# Patient Record
Sex: Male | Born: 1935 | Race: White | Hispanic: No | Marital: Married | Smoking: Former smoker
Health system: Southern US, Community
[De-identification: ages and names within clinical notes are randomized; demographics above are authoritative.]

## PROBLEM LIST (undated history)

## (undated) DIAGNOSIS — Q396 Congenital diverticulum of esophagus: Secondary | ICD-10-CM

## (undated) DIAGNOSIS — I739 Peripheral vascular disease, unspecified: Secondary | ICD-10-CM

## (undated) DIAGNOSIS — I10 Essential (primary) hypertension: Secondary | ICD-10-CM

## (undated) DIAGNOSIS — E785 Hyperlipidemia, unspecified: Secondary | ICD-10-CM

## (undated) DIAGNOSIS — I351 Nonrheumatic aortic (valve) insufficiency: Secondary | ICD-10-CM

## (undated) DIAGNOSIS — I714 Abdominal aortic aneurysm, without rupture, unspecified: Secondary | ICD-10-CM

## (undated) DIAGNOSIS — R233 Spontaneous ecchymoses: Secondary | ICD-10-CM

## (undated) DIAGNOSIS — I712 Thoracic aortic aneurysm, without rupture: Secondary | ICD-10-CM

## (undated) DIAGNOSIS — T4145XA Adverse effect of unspecified anesthetic, initial encounter: Secondary | ICD-10-CM

## (undated) DIAGNOSIS — K922 Gastrointestinal hemorrhage, unspecified: Secondary | ICD-10-CM

## (undated) DIAGNOSIS — T8859XA Other complications of anesthesia, initial encounter: Secondary | ICD-10-CM

## (undated) DIAGNOSIS — K259 Gastric ulcer, unspecified as acute or chronic, without hemorrhage or perforation: Secondary | ICD-10-CM

## (undated) DIAGNOSIS — R238 Other skin changes: Secondary | ICD-10-CM

## (undated) DIAGNOSIS — I7121 Aneurysm of the ascending aorta, without rupture: Secondary | ICD-10-CM

## (undated) DIAGNOSIS — K220 Achalasia of cardia: Secondary | ICD-10-CM

## (undated) DIAGNOSIS — G9340 Encephalopathy, unspecified: Secondary | ICD-10-CM

## (undated) HISTORY — DX: Encephalopathy, unspecified: G93.40

## (undated) HISTORY — DX: Spontaneous ecchymoses: R23.3

## (undated) HISTORY — DX: Gastrointestinal hemorrhage, unspecified: K92.2

## (undated) HISTORY — DX: Aneurysm of the ascending aorta, without rupture: I71.21

## (undated) HISTORY — DX: Abdominal aortic aneurysm, without rupture: I71.4

## (undated) HISTORY — DX: Thoracic aortic aneurysm, without rupture: I71.2

## (undated) HISTORY — DX: Other skin changes: R23.8

## (undated) HISTORY — DX: Achalasia of cardia: K22.0

## (undated) HISTORY — PX: HERNIA REPAIR: SHX51

## (undated) HISTORY — DX: Hyperlipidemia, unspecified: E78.5

## (undated) HISTORY — DX: Peripheral vascular disease, unspecified: I73.9

## (undated) HISTORY — DX: Nonrheumatic aortic (valve) insufficiency: I35.1

## (undated) HISTORY — PX: ABDOMINAL AORTIC ANEURYSM REPAIR: SUR1152

## (undated) HISTORY — DX: Essential (primary) hypertension: I10

## (undated) HISTORY — DX: Abdominal aortic aneurysm, without rupture, unspecified: I71.40

## (undated) HISTORY — PX: TRANSURETHRAL RESECTION OF PROSTATE: SHX73

---

## 1998-06-15 ENCOUNTER — Other Ambulatory Visit: Admission: RE | Admit: 1998-06-15 | Discharge: 1998-06-15 | Payer: Self-pay | Admitting: Cardiology

## 1999-01-24 ENCOUNTER — Encounter: Payer: Self-pay | Admitting: Cardiology

## 1999-01-24 ENCOUNTER — Ambulatory Visit (HOSPITAL_COMMUNITY): Admission: RE | Admit: 1999-01-24 | Discharge: 1999-01-24 | Payer: Self-pay | Admitting: Cardiology

## 2000-01-30 ENCOUNTER — Encounter: Payer: Self-pay | Admitting: Cardiology

## 2000-01-30 ENCOUNTER — Ambulatory Visit (HOSPITAL_COMMUNITY): Admission: RE | Admit: 2000-01-30 | Discharge: 2000-01-30 | Payer: Self-pay | Admitting: Cardiology

## 2001-03-05 ENCOUNTER — Ambulatory Visit (HOSPITAL_COMMUNITY): Admission: RE | Admit: 2001-03-05 | Discharge: 2001-03-05 | Payer: Self-pay | Admitting: Cardiology

## 2001-03-05 ENCOUNTER — Encounter: Payer: Self-pay | Admitting: Cardiology

## 2001-03-06 ENCOUNTER — Ambulatory Visit (HOSPITAL_COMMUNITY): Admission: RE | Admit: 2001-03-06 | Discharge: 2001-03-06 | Payer: Self-pay | Admitting: Cardiology

## 2001-03-06 ENCOUNTER — Encounter: Payer: Self-pay | Admitting: Cardiology

## 2001-03-11 ENCOUNTER — Encounter: Payer: Self-pay | Admitting: Cardiology

## 2001-03-11 ENCOUNTER — Ambulatory Visit (HOSPITAL_COMMUNITY): Admission: RE | Admit: 2001-03-11 | Discharge: 2001-03-11 | Payer: Self-pay | Admitting: Cardiology

## 2001-05-12 ENCOUNTER — Emergency Department (HOSPITAL_COMMUNITY): Admission: EM | Admit: 2001-05-12 | Discharge: 2001-05-12 | Payer: Self-pay | Admitting: Emergency Medicine

## 2001-09-20 ENCOUNTER — Ambulatory Visit (HOSPITAL_COMMUNITY): Admission: RE | Admit: 2001-09-20 | Discharge: 2001-09-20 | Payer: Self-pay | Admitting: Gastroenterology

## 2002-09-09 ENCOUNTER — Ambulatory Visit (HOSPITAL_COMMUNITY): Admission: RE | Admit: 2002-09-09 | Discharge: 2002-09-09 | Payer: Self-pay | Admitting: Cardiology

## 2002-09-09 ENCOUNTER — Encounter: Payer: Self-pay | Admitting: Cardiology

## 2002-12-23 ENCOUNTER — Ambulatory Visit (HOSPITAL_COMMUNITY): Admission: RE | Admit: 2002-12-23 | Discharge: 2002-12-23 | Payer: Self-pay | Admitting: Gastroenterology

## 2003-04-14 ENCOUNTER — Ambulatory Visit (HOSPITAL_COMMUNITY): Admission: RE | Admit: 2003-04-14 | Discharge: 2003-04-14 | Payer: Self-pay | Admitting: Gastroenterology

## 2003-07-20 ENCOUNTER — Encounter: Payer: Self-pay | Admitting: Internal Medicine

## 2003-07-20 ENCOUNTER — Encounter: Admission: RE | Admit: 2003-07-20 | Discharge: 2003-07-20 | Payer: Self-pay | Admitting: Internal Medicine

## 2003-07-27 ENCOUNTER — Encounter: Admission: RE | Admit: 2003-07-27 | Discharge: 2003-07-27 | Payer: Self-pay | Admitting: Internal Medicine

## 2003-07-27 ENCOUNTER — Encounter: Payer: Self-pay | Admitting: Internal Medicine

## 2003-09-14 ENCOUNTER — Encounter: Payer: Self-pay | Admitting: *Deleted

## 2003-09-14 ENCOUNTER — Encounter: Admission: RE | Admit: 2003-09-14 | Discharge: 2003-09-14 | Payer: Self-pay | Admitting: *Deleted

## 2003-09-22 ENCOUNTER — Encounter: Payer: Self-pay | Admitting: *Deleted

## 2003-09-24 ENCOUNTER — Ambulatory Visit (HOSPITAL_COMMUNITY): Admission: RE | Admit: 2003-09-24 | Discharge: 2003-09-24 | Payer: Self-pay | Admitting: *Deleted

## 2003-10-08 ENCOUNTER — Inpatient Hospital Stay (HOSPITAL_COMMUNITY): Admission: RE | Admit: 2003-10-08 | Discharge: 2003-11-20 | Payer: Self-pay | Admitting: *Deleted

## 2003-10-21 ENCOUNTER — Encounter: Payer: Self-pay | Admitting: Cardiology

## 2003-12-02 ENCOUNTER — Encounter: Admission: RE | Admit: 2003-12-02 | Discharge: 2004-03-01 | Payer: Self-pay | Admitting: General Surgery

## 2003-12-24 ENCOUNTER — Ambulatory Visit (HOSPITAL_COMMUNITY): Admission: RE | Admit: 2003-12-24 | Discharge: 2003-12-24 | Payer: Self-pay | Admitting: General Surgery

## 2005-04-28 IMAGING — CT CT HEAD W/O CM
1 series · 16 of 30 positions shown, 20 images · non-contrast
Comparison: none

CLINICAL DATA: Ventilator, Status post AAA repair.  Altered mental status. 
 CT HEAD WITHOUT CONTRAST 10/11/03
 Routine imaging performed through the brain without IV contrast. 
 There is diffuse cerebral atrophy.  No acute intracranial abnormality.  Specifically no evidence of hemorrhage, hydrocephalus, tumor, vascular lesion, acute infarction or significant intracranial injury.  Mild chronic sinusitis changes in the paranasal sinuses.  Calvarium unremarkable.  
 IMPRESSION
 Atrophy.  No acute intracranial process.

[Series 2: trauma head · axial · 0.47mm/px · z∈[+104,+245]mm · 16 of 30 slices shown, 20 images]
[im 2/30  brain]
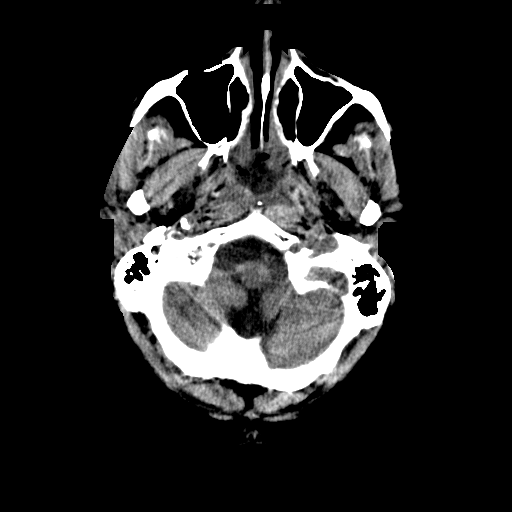
[im 2/30  bone]
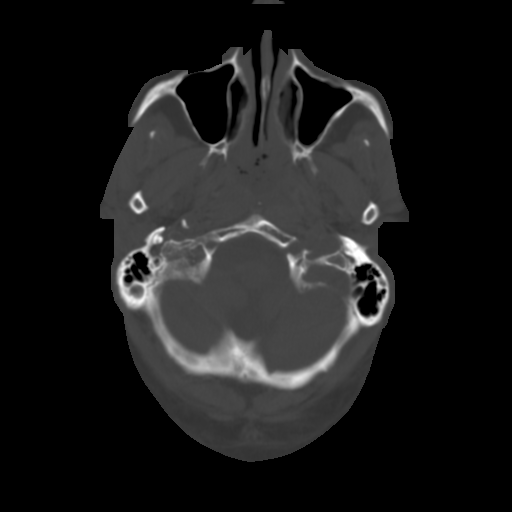
[im 4/30  brain]
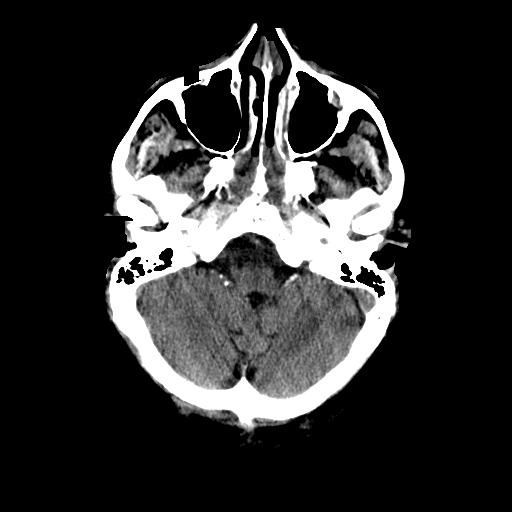
[im 6/30  brain]
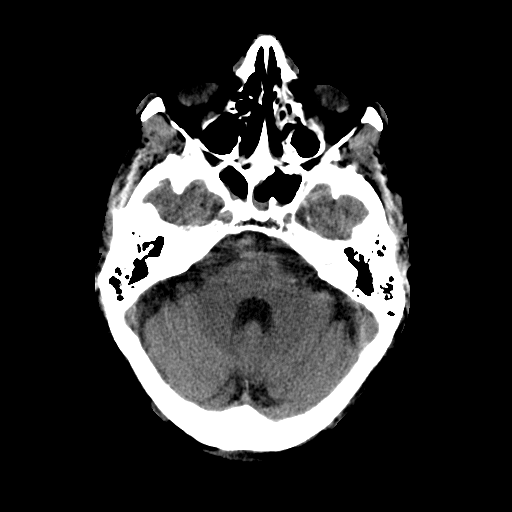
[im 8/30  brain]
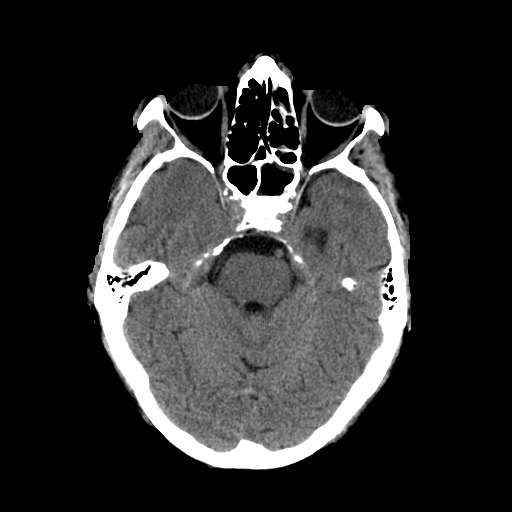
[im 9/30  brain]
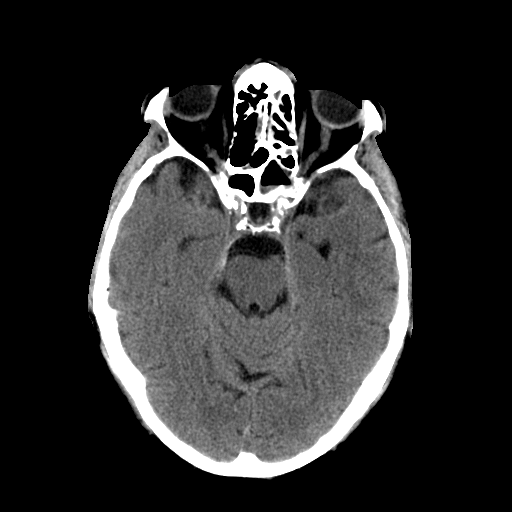
[im 9/30  bone]
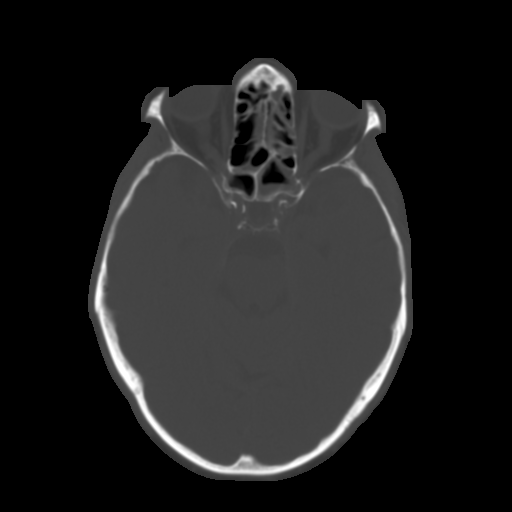
[im 11/30  brain]
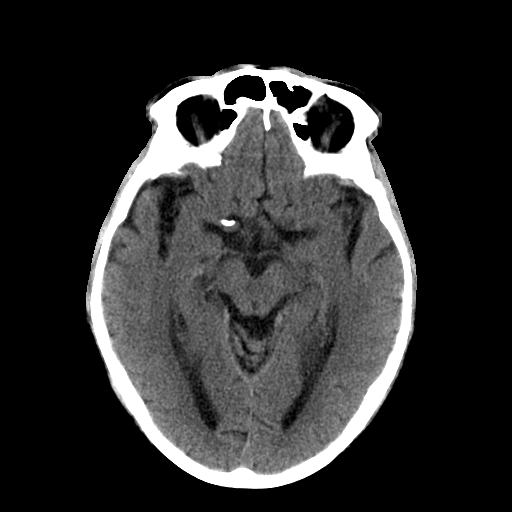
[im 13/30  brain]
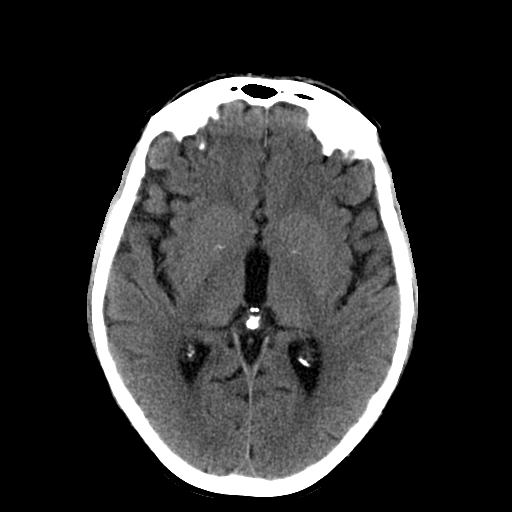
[im 15/30  brain]
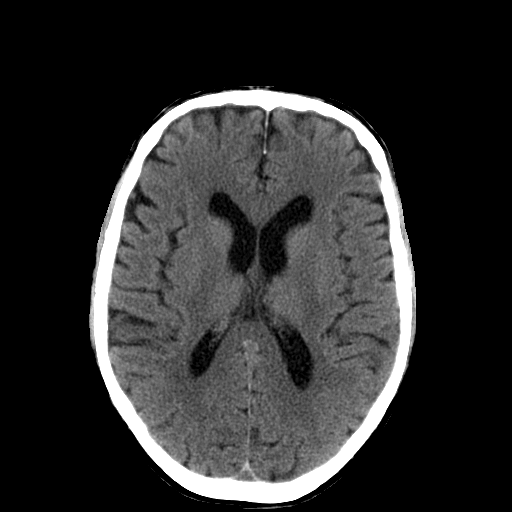
[im 16/30  brain]
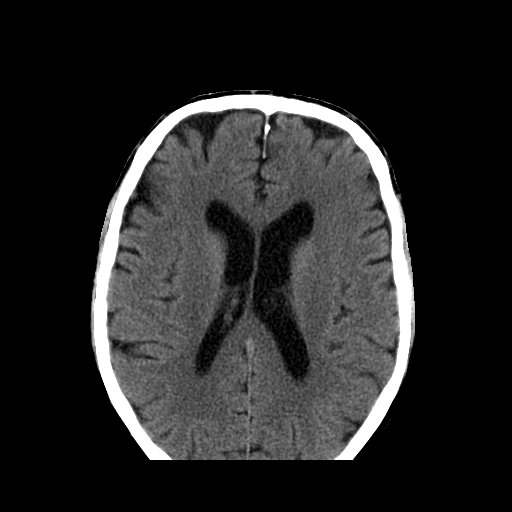
[im 16/30  bone]
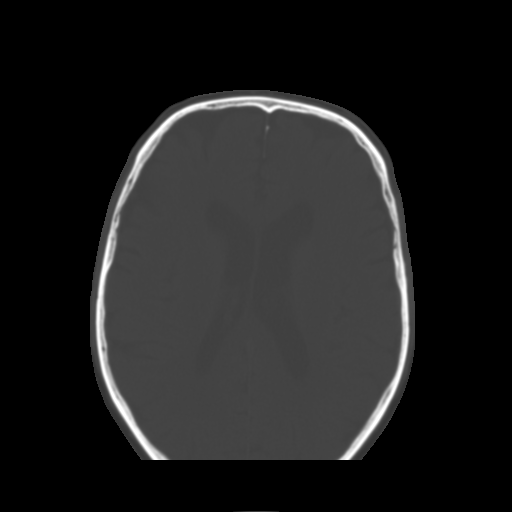
[im 18/30  brain]
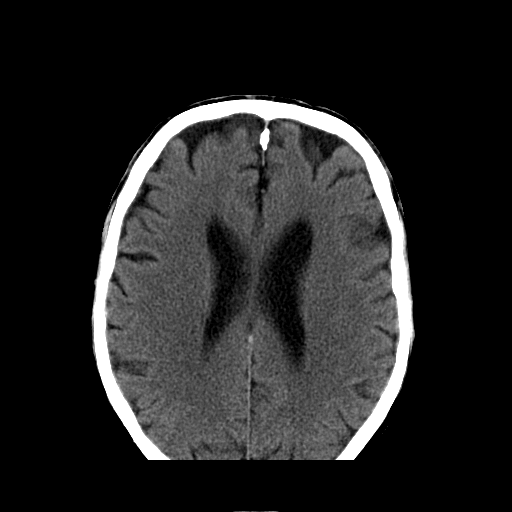
[im 20/30  brain]
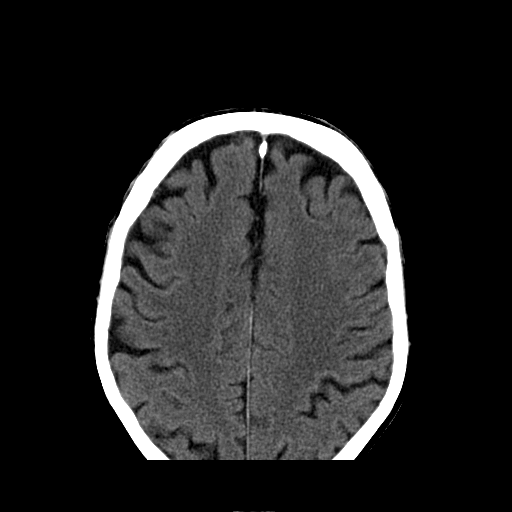
[im 22/30  brain]
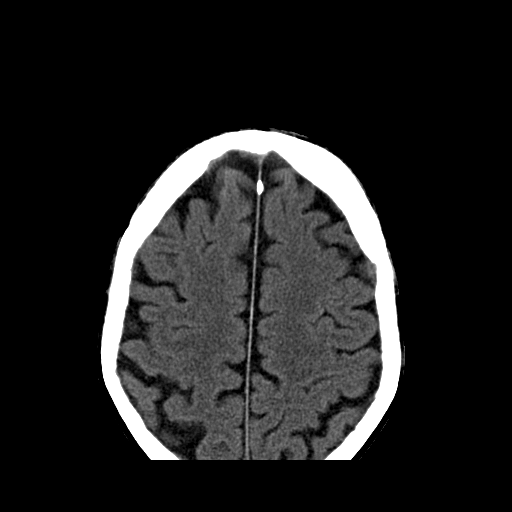
[im 23/30  brain]
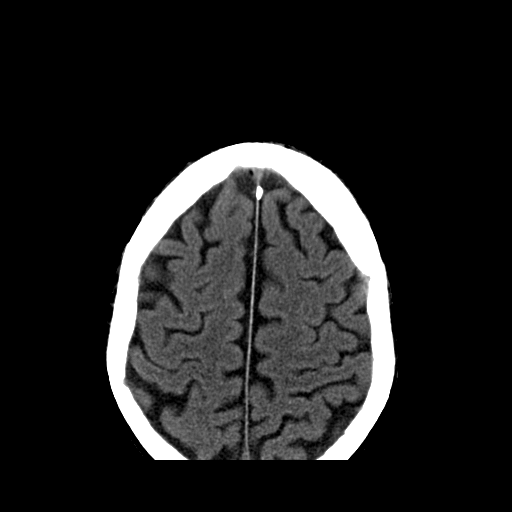
[im 23/30  bone]
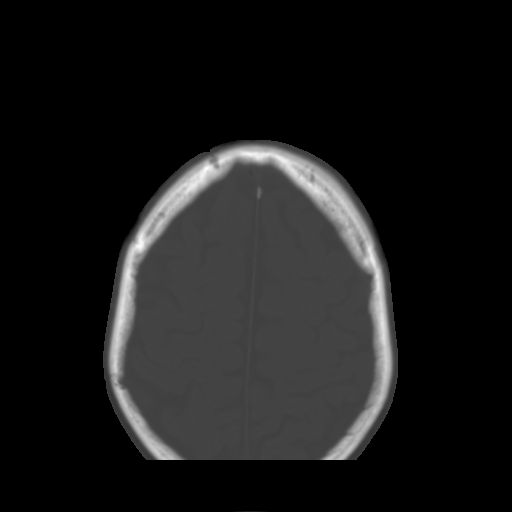
[im 25/30  brain]
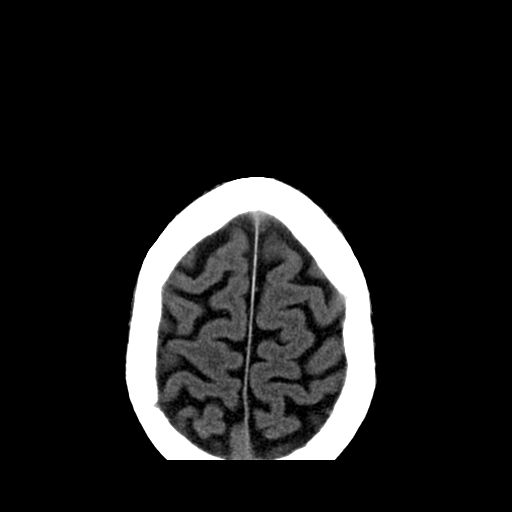
[im 27/30  brain]
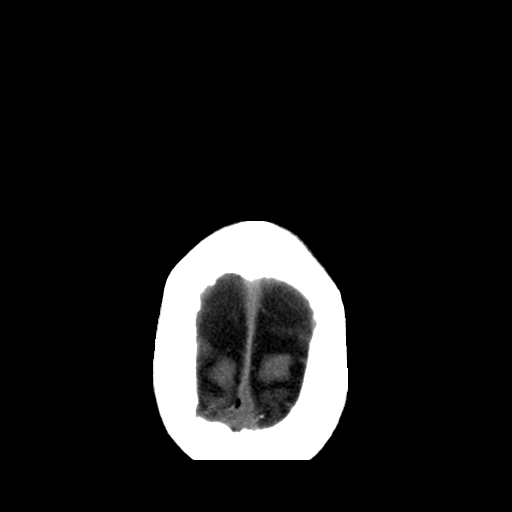
[im 29/30  brain]
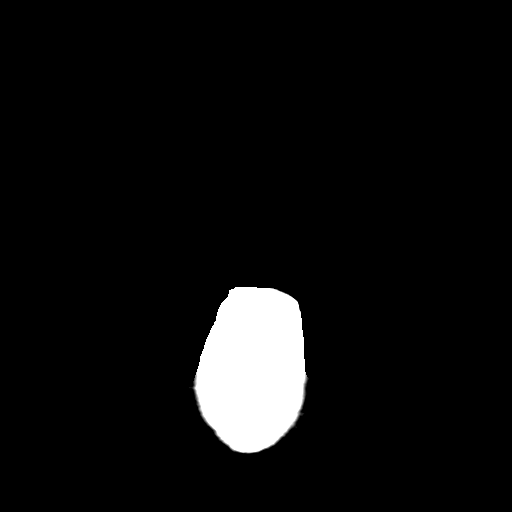

[16 of 30 positions shown; findings below may reference images not displayed]

## 2005-05-08 IMAGING — CT CT HEAD W/O CM
1 series · 16 of 30 positions shown, 20 images · non-contrast
Comparison: none

CLINICAL DATA: Mental status changes. Recent vascular surgery.
 HEAD CT WITHOUT CONTRAST
 5mm scans were made through the whole head and compared to a study of 10/11/03
 There is no definable change since the prior examination.  The brain shows mild generalized atrophy without evidence of focal stroke, mass, hemorrhage, hydrocephalus or extra-axial collection.  The paranasal sinuses show some inflammatory changes in the sphenoid and ethmoid regions.  
 IMPRESSION 
 Mild brain atrophy without evidence of acute or focal pathology. No change when compared to the study of [DATE]. 
 Sinusitis.

[Series 2: brain · axial · 0.47mm/px · z∈[+131,+272]mm · 16 of 30 slices shown, 20 images]
[im 2/30  brain]
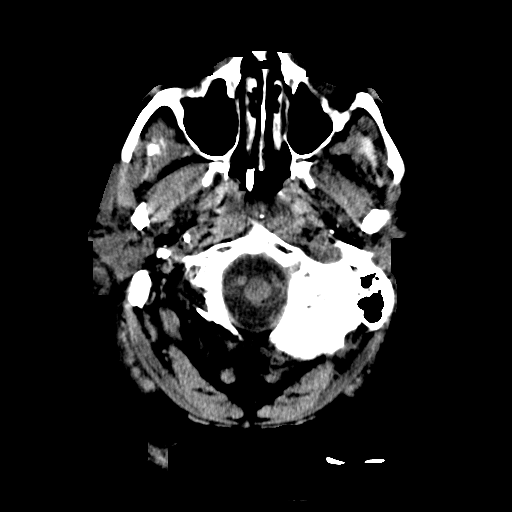
[im 2/30  bone]
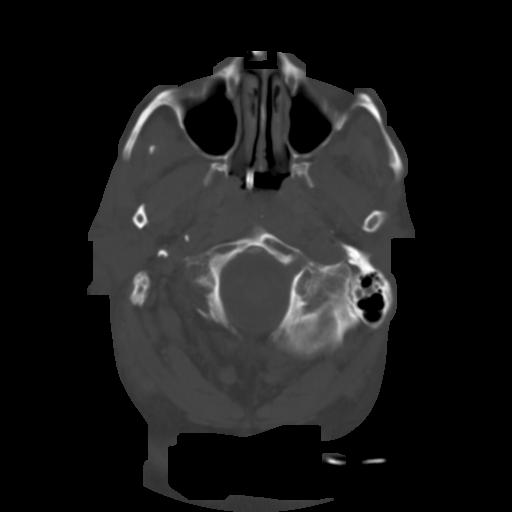
[im 4/30  brain]
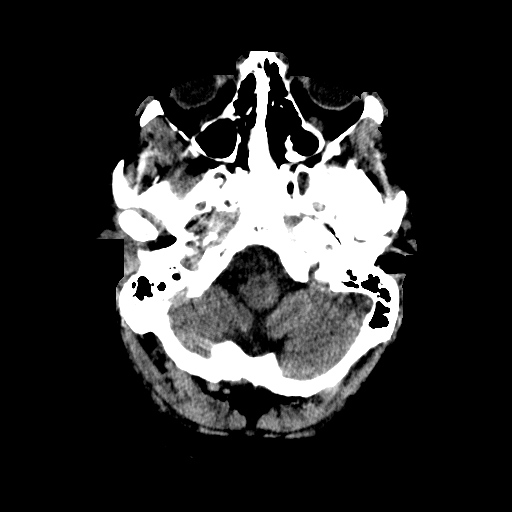
[im 6/30  brain]
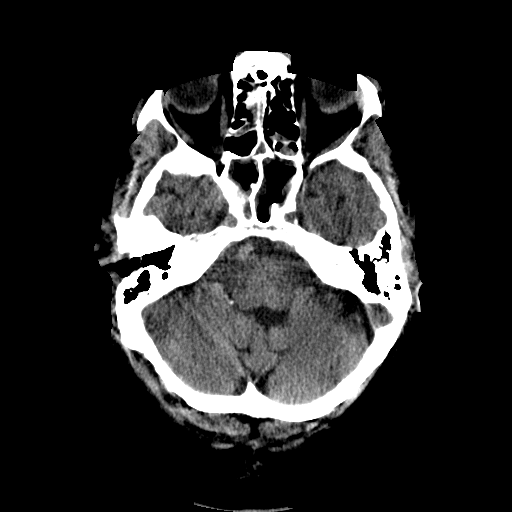
[im 8/30  brain]
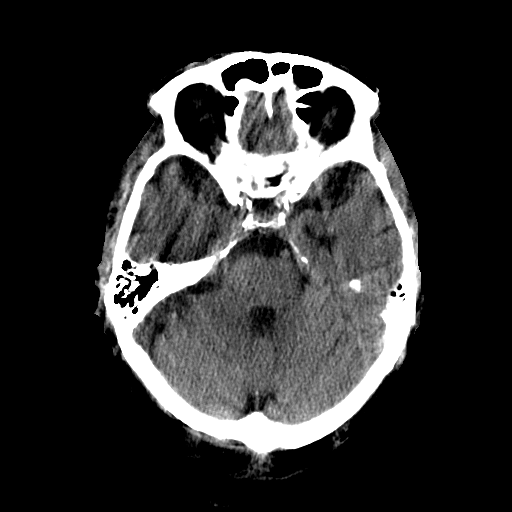
[im 9/30  brain]
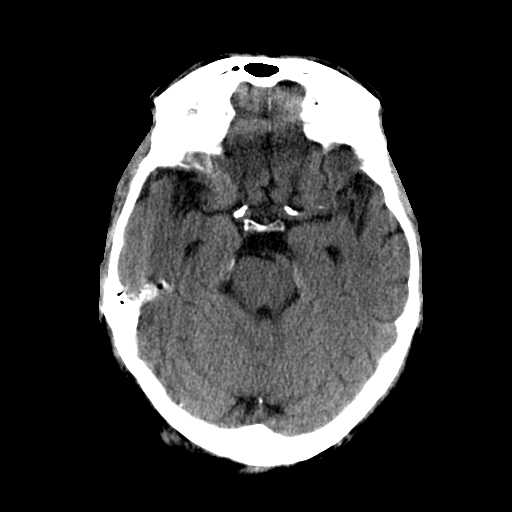
[im 9/30  bone]
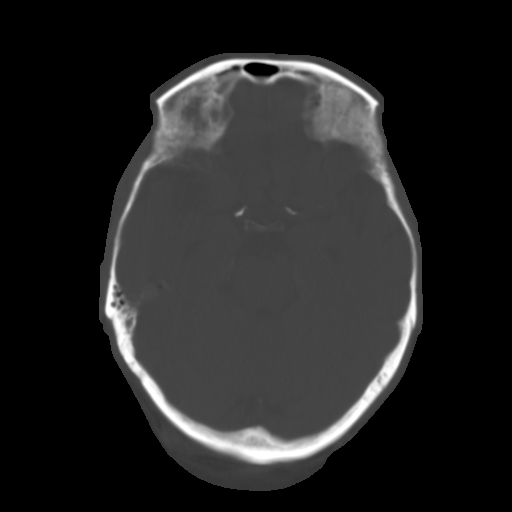
[im 11/30  brain]
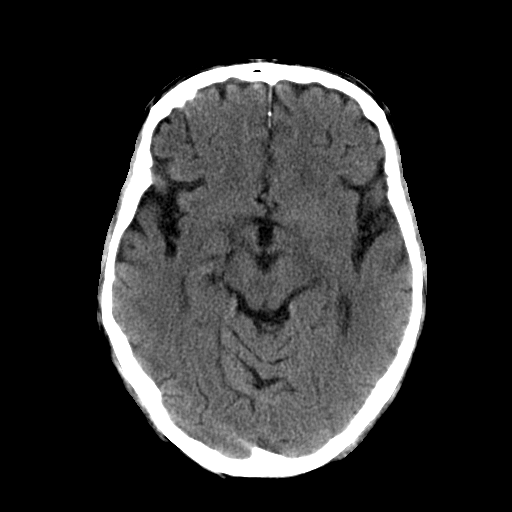
[im 13/30  brain]
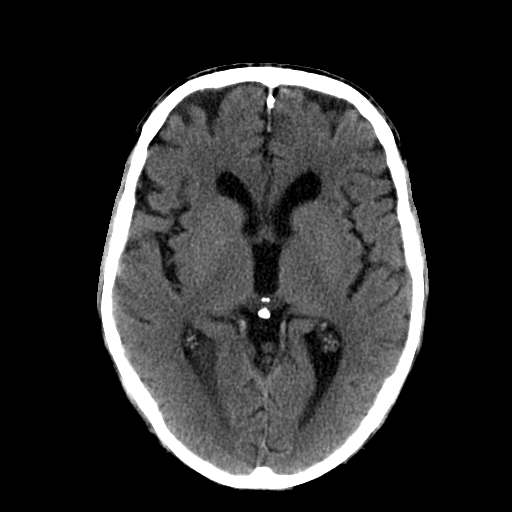
[im 15/30  brain]
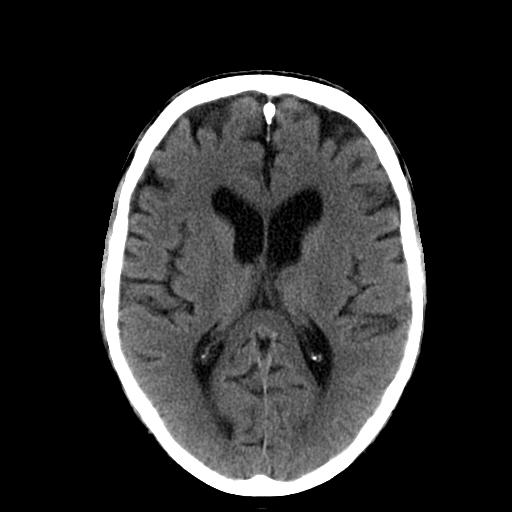
[im 16/30  brain]
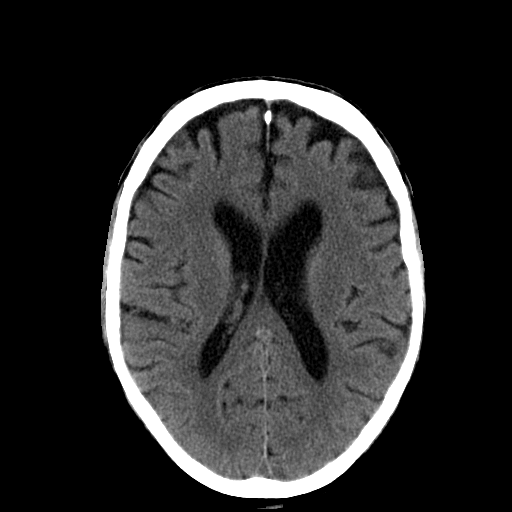
[im 16/30  bone]
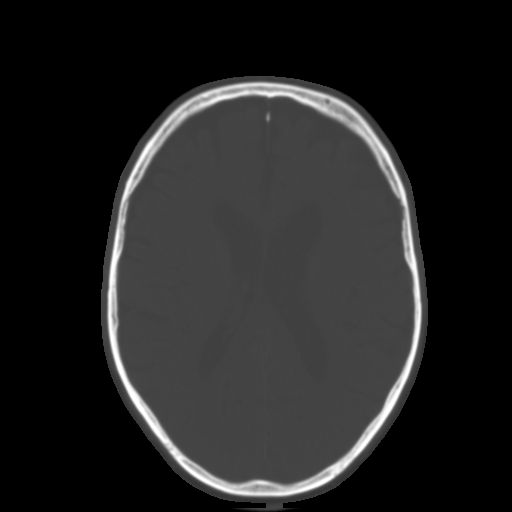
[im 18/30  brain]
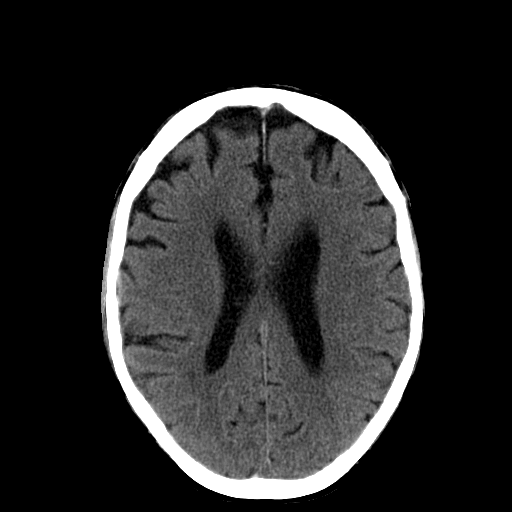
[im 20/30  brain]
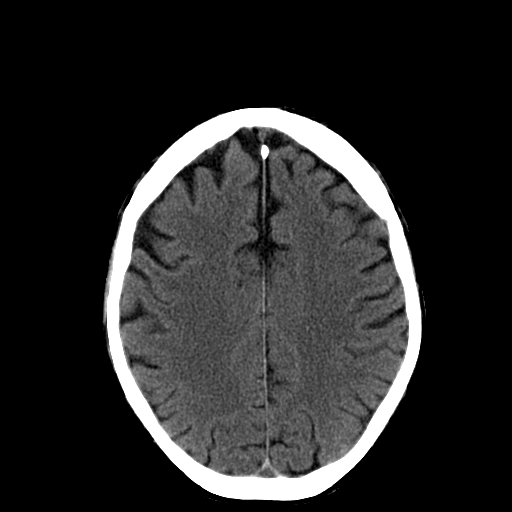
[im 22/30  brain]
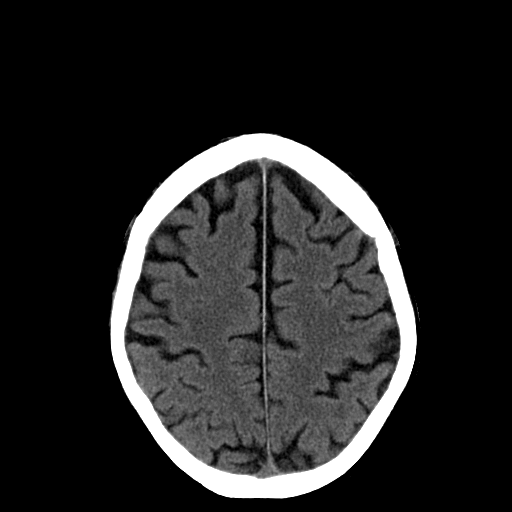
[im 23/30  brain]
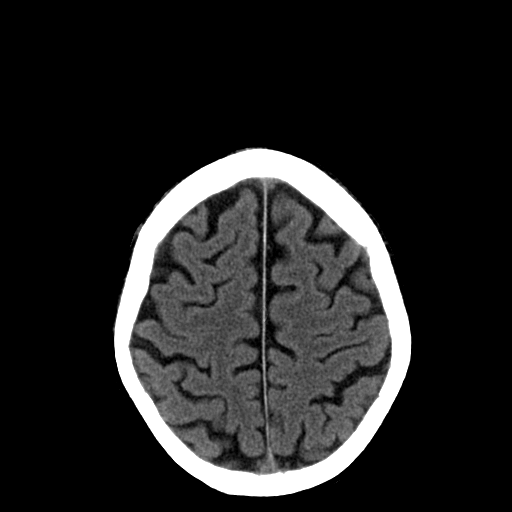
[im 23/30  bone]
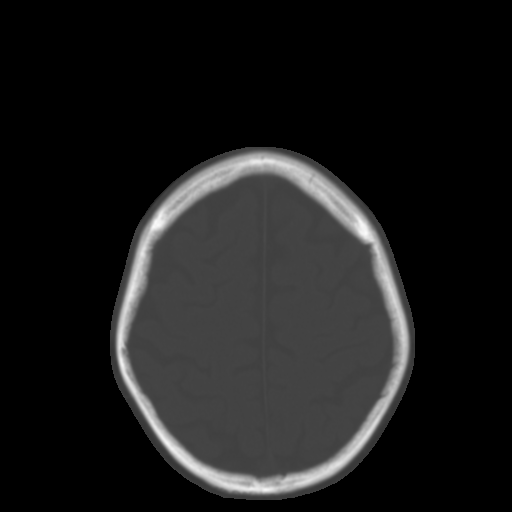
[im 25/30  brain]
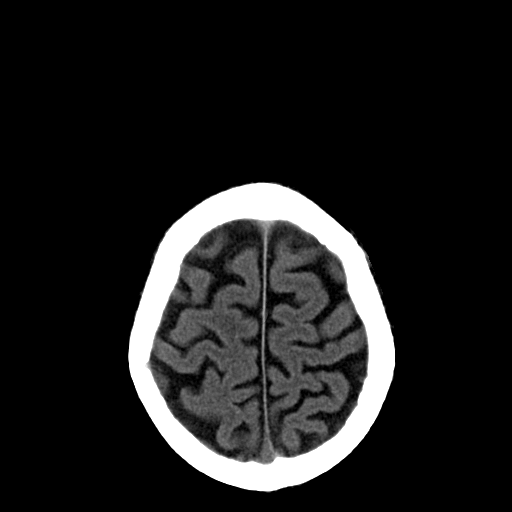
[im 27/30  brain]
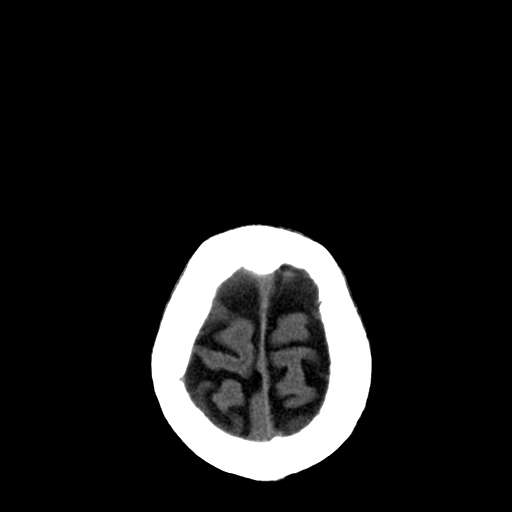
[im 29/30  brain]
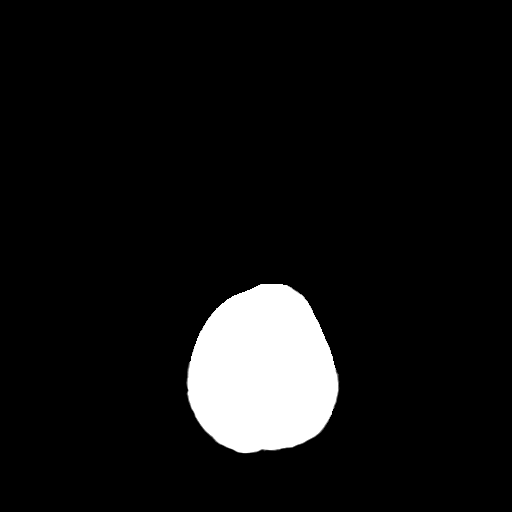

[16 of 30 positions shown; findings below may reference images not displayed]

## 2005-09-19 ENCOUNTER — Ambulatory Visit (HOSPITAL_COMMUNITY): Admission: RE | Admit: 2005-09-19 | Discharge: 2005-09-19 | Payer: Self-pay | Admitting: Cardiology

## 2005-09-19 HISTORY — PX: CARDIAC CATHETERIZATION: SHX172

## 2007-08-08 ENCOUNTER — Observation Stay (HOSPITAL_COMMUNITY): Admission: EM | Admit: 2007-08-08 | Discharge: 2007-08-08 | Payer: Self-pay | Admitting: Emergency Medicine

## 2007-12-26 ENCOUNTER — Encounter (INDEPENDENT_AMBULATORY_CARE_PROVIDER_SITE_OTHER): Payer: Self-pay | Admitting: Urology

## 2007-12-26 ENCOUNTER — Inpatient Hospital Stay (HOSPITAL_COMMUNITY): Admission: RE | Admit: 2007-12-26 | Discharge: 2007-12-28 | Payer: Self-pay | Admitting: Urology

## 2008-02-26 ENCOUNTER — Emergency Department (HOSPITAL_COMMUNITY): Admission: EM | Admit: 2008-02-26 | Discharge: 2008-02-26 | Payer: Self-pay | Admitting: Emergency Medicine

## 2008-07-29 ENCOUNTER — Ambulatory Visit (HOSPITAL_COMMUNITY): Admission: RE | Admit: 2008-07-29 | Discharge: 2008-07-29 | Payer: Self-pay | Admitting: Internal Medicine

## 2010-08-18 ENCOUNTER — Ambulatory Visit: Payer: Self-pay | Admitting: Cardiology

## 2010-10-18 ENCOUNTER — Ambulatory Visit: Payer: Self-pay | Admitting: Cardiology

## 2010-10-18 ENCOUNTER — Ambulatory Visit: Payer: Self-pay | Admitting: Cardiovascular Disease

## 2010-12-11 HISTORY — PX: PEG PLACEMENT: SHX5437

## 2011-01-31 ENCOUNTER — Other Ambulatory Visit: Payer: Self-pay | Admitting: Gastroenterology

## 2011-01-31 DIAGNOSIS — Q396 Congenital diverticulum of esophagus: Secondary | ICD-10-CM

## 2011-02-03 ENCOUNTER — Other Ambulatory Visit: Payer: Self-pay | Admitting: Gastroenterology

## 2011-02-03 ENCOUNTER — Other Ambulatory Visit (HOSPITAL_COMMUNITY): Payer: Self-pay | Admitting: Gastroenterology

## 2011-02-03 ENCOUNTER — Ambulatory Visit
Admission: RE | Admit: 2011-02-03 | Discharge: 2011-02-03 | Disposition: A | Discharge status: Home / Self Care | Payer: PRIVATE HEALTH INSURANCE | Source: Ambulatory Visit | Attending: Gastroenterology | Admitting: Gastroenterology

## 2011-02-03 DIAGNOSIS — Q396 Congenital diverticulum of esophagus: Secondary | ICD-10-CM

## 2011-02-03 DIAGNOSIS — T17998A Other foreign object in respiratory tract, part unspecified causing other injury, initial encounter: Secondary | ICD-10-CM

## 2011-02-07 ENCOUNTER — Inpatient Hospital Stay (HOSPITAL_COMMUNITY): Payer: Medicare Other

## 2011-02-07 ENCOUNTER — Ambulatory Visit (HOSPITAL_COMMUNITY)
Admission: RE | Admit: 2011-02-07 | Discharge: 2011-02-07 | Disposition: A | Discharge status: Home / Self Care | Payer: Medicare Other | Source: Ambulatory Visit | Attending: Gastroenterology | Admitting: Gastroenterology

## 2011-02-07 ENCOUNTER — Inpatient Hospital Stay (HOSPITAL_COMMUNITY)
Admission: AD | Admit: 2011-02-07 | Discharge: 2011-02-14 | DRG: 392 | Disposition: A | Discharge status: Home Health | Payer: Medicare Other | Source: Ambulatory Visit | Attending: Gastroenterology | Admitting: Gastroenterology

## 2011-02-07 DIAGNOSIS — E46 Unspecified protein-calorie malnutrition: Secondary | ICD-10-CM | POA: Diagnosis present

## 2011-02-07 DIAGNOSIS — I2510 Atherosclerotic heart disease of native coronary artery without angina pectoris: Secondary | ICD-10-CM | POA: Diagnosis present

## 2011-02-07 DIAGNOSIS — Z7982 Long term (current) use of aspirin: Secondary | ICD-10-CM

## 2011-02-07 DIAGNOSIS — J4489 Other specified chronic obstructive pulmonary disease: Secondary | ICD-10-CM | POA: Diagnosis present

## 2011-02-07 DIAGNOSIS — Z87891 Personal history of nicotine dependence: Secondary | ICD-10-CM

## 2011-02-07 DIAGNOSIS — I359 Nonrheumatic aortic valve disorder, unspecified: Secondary | ICD-10-CM | POA: Diagnosis present

## 2011-02-07 DIAGNOSIS — J449 Chronic obstructive pulmonary disease, unspecified: Secondary | ICD-10-CM | POA: Diagnosis present

## 2011-02-07 DIAGNOSIS — I10 Essential (primary) hypertension: Secondary | ICD-10-CM | POA: Diagnosis present

## 2011-02-07 DIAGNOSIS — I4949 Other premature depolarization: Secondary | ICD-10-CM | POA: Diagnosis present

## 2011-02-07 DIAGNOSIS — Q396 Congenital diverticulum of esophagus: Secondary | ICD-10-CM

## 2011-02-07 DIAGNOSIS — K220 Achalasia of cardia: Secondary | ICD-10-CM | POA: Diagnosis present

## 2011-02-07 DIAGNOSIS — T17998A Other foreign object in respiratory tract, part unspecified causing other injury, initial encounter: Secondary | ICD-10-CM

## 2011-02-07 DIAGNOSIS — I739 Peripheral vascular disease, unspecified: Secondary | ICD-10-CM | POA: Diagnosis present

## 2011-02-07 DIAGNOSIS — R627 Adult failure to thrive: Secondary | ICD-10-CM | POA: Diagnosis present

## 2011-02-07 DIAGNOSIS — Q398 Other congenital malformations of esophagus: Secondary | ICD-10-CM

## 2011-02-07 DIAGNOSIS — R1310 Dysphagia, unspecified: Principal | ICD-10-CM | POA: Diagnosis present

## 2011-02-07 DIAGNOSIS — N400 Enlarged prostate without lower urinary tract symptoms: Secondary | ICD-10-CM | POA: Diagnosis present

## 2011-02-07 DIAGNOSIS — R112 Nausea with vomiting, unspecified: Secondary | ICD-10-CM | POA: Diagnosis present

## 2011-02-07 DIAGNOSIS — F411 Generalized anxiety disorder: Secondary | ICD-10-CM | POA: Diagnosis present

## 2011-02-07 LAB — CBC
HCT: 36.6 % — ABNORMAL LOW (ref 39.0–52.0)
Hemoglobin: 12.3 g/dL — ABNORMAL LOW (ref 13.0–17.0)
MCH: 29.4 pg (ref 26.0–34.0)
MCHC: 33.6 g/dL (ref 30.0–36.0)
MCV: 87.4 fL (ref 78.0–100.0)
Platelets: 188 10*3/uL (ref 150–400)
RBC: 4.19 MIL/uL — ABNORMAL LOW (ref 4.22–5.81)
RDW: 13.8 % (ref 11.5–15.5)
WBC: 11.9 10*3/uL — ABNORMAL HIGH (ref 4.0–10.5)

## 2011-02-07 LAB — COMPREHENSIVE METABOLIC PANEL
ALT: 13 U/L (ref 0–53)
AST: 14 U/L (ref 0–37)
Albumin: 3.1 g/dL — ABNORMAL LOW (ref 3.5–5.2)
Alkaline Phosphatase: 61 U/L (ref 39–117)
BUN: 11 mg/dL (ref 6–23)
CO2: 28 mEq/L (ref 19–32)
Calcium: 8.8 mg/dL (ref 8.4–10.5)
Chloride: 106 mEq/L (ref 96–112)
Creatinine, Ser: 0.93 mg/dL (ref 0.4–1.5)
GFR calc Af Amer: >60 mL/min (ref >60)
GFR calc non Af Amer: >60 mL/min (ref >60)
Glucose, Bld: 93 mg/dL (ref 70–99)
Potassium: 3.9 mEq/L (ref 3.5–5.1)
Sodium: 141 mEq/L (ref 135–145)
Total Bilirubin: 0.8 mg/dL (ref 0.3–1.2)
Total Protein: 5.2 g/dL — ABNORMAL LOW (ref 6.0–8.3)

## 2011-02-07 LAB — PROTIME-INR
INR: 1.16 (ref 0.00–1.49)
Prothrombin Time: 15 seconds (ref 11.6–15.2)

## 2011-02-08 ENCOUNTER — Inpatient Hospital Stay (HOSPITAL_COMMUNITY): Payer: Medicare Other

## 2011-02-08 DIAGNOSIS — Z01810 Encounter for preprocedural cardiovascular examination: Secondary | ICD-10-CM

## 2011-02-09 ENCOUNTER — Ambulatory Visit (HOSPITAL_COMMUNITY): Payer: PRIVATE HEALTH INSURANCE

## 2011-02-09 DIAGNOSIS — I2510 Atherosclerotic heart disease of native coronary artery without angina pectoris: Secondary | ICD-10-CM

## 2011-02-09 DIAGNOSIS — I359 Nonrheumatic aortic valve disorder, unspecified: Secondary | ICD-10-CM

## 2011-02-09 LAB — BASIC METABOLIC PANEL
BUN: 4 mg/dL — ABNORMAL LOW (ref 6–23)
CO2: 25 mEq/L (ref 19–32)
Calcium: 8.4 mg/dL (ref 8.4–10.5)
Chloride: 109 mEq/L (ref 96–112)
Creatinine, Ser: 0.75 mg/dL (ref 0.4–1.5)
GFR calc Af Amer: >60 mL/min (ref >60)
GFR calc non Af Amer: >60 mL/min (ref >60)
Glucose, Bld: 108 mg/dL — ABNORMAL HIGH (ref 70–99)
Potassium: 3.8 mEq/L (ref 3.5–5.1)
Sodium: 139 mEq/L (ref 135–145)

## 2011-02-09 LAB — CBC
HCT: 35.3 % — ABNORMAL LOW (ref 39.0–52.0)
Hemoglobin: 11.9 g/dL — ABNORMAL LOW (ref 13.0–17.0)
MCH: 29.7 pg (ref 26.0–34.0)
MCHC: 33.7 g/dL (ref 30.0–36.0)
MCV: 88 fL (ref 78.0–100.0)
Platelets: 182 10*3/uL (ref 150–400)
RBC: 4.01 MIL/uL — ABNORMAL LOW (ref 4.22–5.81)
RDW: 13.7 % (ref 11.5–15.5)
WBC: 8.7 10*3/uL (ref 4.0–10.5)

## 2011-02-09 LAB — CROSSMATCH
ABO/RH(D): O POS
Antibody Screen: NEGATIVE

## 2011-02-09 LAB — SURGICAL PCR SCREEN
MRSA, PCR: NEGATIVE
Staphylococcus aureus: POSITIVE — AB

## 2011-02-09 LAB — URINALYSIS, ROUTINE W REFLEX MICROSCOPIC
Hgb urine dipstick: NEGATIVE
Specific Gravity, Urine: 1.013 (ref 1.005–1.030)
Urine Glucose, Fasting: NEGATIVE mg/dL
pH: 7.5 (ref 5.0–8.0)

## 2011-02-09 NOTE — Consult Note (Signed)
NAME:  Valcarcel, Thaddaeus                    ACCOUNT NO.:  [id number]  MEDICAL RECORD NO.:  [id number]           PATIENT TYPE:  I  LOCATION:  3731                         FACILITY:  [facility]  PHYSICIAN:  Vesta Mixer, M.D. DATE OF BIRTH:  16-Dec-1928  DATE OF CONSULTATION:  02/08/2011 DATE OF DISCHARGE:                                CONSULTATION   Corey [name] is a 77 year old gentleman with a history of abdominal aortic aneurysm repair and aortic insufficiency.  Corey [name] was admitted for dysphagia and weight loss.  Corey [name] is scheduled to have a G tube placed and we are asked to see him for preoperative evaluation.  Corey [name] is an elderly gentleman and has been followed by Dr. Deborah Chalk for many years.  Corey [name] has moderate coronary artery disease.  Corey [name] had abdominal aortic aneurysm repair approximately 10 years ago.  Corey [name] has known aortic insufficiency, which has been fairly stable.  Corey [name] has never had any episodes of chest pain or shortness breath, and basically has not had any cardiac symptoms. CURRENT MEDICATIONS: 1. Restoril at night. 2. Toprol-XL 25 mg a day. 3. Loratadine 10 mg a day. 4. Mucinex twice a day. 5. HCTZ 12.5 mg a day. 6. Aspirin 81 mg a day. 7. Simvastatin 80 mg a day. 8. Monopril 20 mg a day.  ALLERGIES:  No known drug allergies.  PAST MEDICAL HISTORY: 1. Abdominal aortic aneurysm repair. 2. Known moderate aortic insufficiency. 3. History of achalasia with a large esophageal diverticulum. 4. History of TURP.  SOCIAL HISTORY:  The patient does not drink alcohol.  Corey [name] has a history of smoking in the remote past.  FAMILY HISTORY:  Positive for colon cancer.  There is also a strong history of aneurysms.  REVIEW OF SYSTEMS:  As noted in the HPI.  All other systems are negative.  PHYSICAL EXAMINATION:  GENERAL:  Corey [name] is an elderly gentleman in no acute distress.  Corey [name] is quite thin and frail appearing. VITAL SIGNS:  His temperature is 98.4, his heart rate is 80, his blood pressure is  129/73, his O2 saturation is 94% on room air. HEENT:  Exam reveals 2+ carotids.  Corey [name] has no bruits.  There is no JVD. His mucous membranes are fairly moist.  His sclerae are nonicteric. LUNGS:  Clear. HEART:  Regular rate.  S1 and S2.  Corey [name] has a soft systolic murmur at the left sternal border as well as a soft diastolic murmur.  His PMI is nondisplaced. ABDOMEN:  His abdominal exam is quite thin.  There is no hepatosplenomegaly. EXTREMITIES:  Corey [name] has no clubbing, cyanosis, or edema.  His pulses are bounding.  Corey [name] has no palpable cords. NEUROLOGIC:  Exam is nonfocal.  His EKG was not at the chart.  IMPRESSION AND PLAN:  Preoperative risk.  Corey [name] should be at acceptable risk to have general anesthesia for G-tube placement.  Corey [name] is at some increased risk given his frail condition and the fact that Corey [name] has aortic insufficiency.  We will need to be fairly careful with his IV fluids, but I think that Corey [name] will tolerate it quite  well.  Frankly, I do not think that we have any other options.  Corey [name] has lost 30 pounds over the past year and Corey [name] has not been able to keep enough food down to keep his weight up.  I think that failing to operate would certainly result in his death and I think his risk of surgery is not all that high.  I have discussed all of these issues with his family and the patient, and they all agree to proceed in several days.  We will get an echocardiogram to ensure that we have a good understanding of his left ventricular systolic function as well as his valvular function.  We will also make sure that we have an EKG on the chart.  All of his other medical problems remain stable.     Vesta Mixer, M.D.     PJN/MEDQ  D:  02/08/2011  T:  02/09/2011  Job:  [id number]  cc:   Angelia Mould. Derrell Lolling, M.D. Colleen Can. Deborah Chalk, M.D.  Electronically Signed by Kristeen Miss M.D. on 02/09/2011 06:17:35 PM

## 2011-02-10 DIAGNOSIS — K225 Diverticulum of esophagus, acquired: Secondary | ICD-10-CM

## 2011-02-10 DIAGNOSIS — I359 Nonrheumatic aortic valve disorder, unspecified: Secondary | ICD-10-CM

## 2011-02-11 LAB — CBC
HCT: 37.2 % — ABNORMAL LOW (ref 39.0–52.0)
MCH: 29 pg (ref 26.0–34.0)
MCV: 88.4 fL (ref 78.0–100.0)
RDW: 13.8 % (ref 11.5–15.5)
WBC: 11.2 10*3/uL — ABNORMAL HIGH (ref 4.0–10.5)

## 2011-02-11 LAB — BASIC METABOLIC PANEL
BUN: 4 mg/dL — ABNORMAL LOW (ref 6–23)
CO2: 28 mEq/L (ref 19–32)
Calcium: 8.6 mg/dL (ref 8.4–10.5)
Creatinine, Ser: 0.73 mg/dL (ref 0.4–1.5)
Glucose, Bld: 87 mg/dL (ref 70–99)
Sodium: 138 mEq/L (ref 135–145)

## 2011-02-11 NOTE — Op Note (Signed)
NAME:  Larabee, Liberty                    ACCOUNT NO.:  [id number]  MEDICAL RECORD NO.:  [id number]           PATIENT TYPE:  I  LOCATION:  3713                         FACILITY:  [facility]  PHYSICIAN:  Angelia Mould. Derrell Lolling, M.D.DATE OF BIRTH:  1929/05/02  DATE OF PROCEDURE:  02/10/2011 DATE OF DISCHARGE:                              OPERATIVE REPORT   PREOPERATIVE DIAGNOSES: 1. Achalasia with distal esophageal diverticulum. 2. Protein-calorie malnutrition.  POSTOPERATIVE DIAGNOSES: 1. Achalasia with distal esophageal diverticulum. 2. Protein-calorie malnutrition.  OPERATION PERFORMED:  Laparotomy with open placement of Stamm gastrostomy (22-French Foley with 10 mL balloon).  SURGEON:  Angelia Mould. Derrell Lolling, MD  ASSISTANT:  Brayton El, PA-C  OPERATIVE INDICATIONS:  This is a 77 year old Caucasian male.  He has a history of abdominal aortic aneurysm resection and reoperation for complications back in 2004.  He had a prolonged hospitalization requiring tracheostomy as well as open gastrostomy tube placement at that time.  He recovered from that surgery and that gastrostomy tube was removed.  He has now developed a 30-pound weight loss secondary to achalasia and a large distal esophageal diverticulum.  Dr. Madilyn Fireman was unable to safely place an endoscopic gastrostomy tube.  Radiology was unable to safely place a radiologically directed gastrostomy tube.  It is not felt that the patient is a candidate for myotomy or esophageal resection at this point.  It was felt that his nutrition needed to be much better to consider that.  I was asked to see the patient regarding placement of gastrostomy tube.  I counseled him and his wife regarding this.  We felt that this was a reasonable thing to reestablish nutritional support as he cannot swallow very well.  He is brought to operating room for this procedure.  OPERATIVE TECHNIQUE:  Following the induction of general endotracheal anesthesia, the  patient's abdomen was prepped and draped in a sterile fashion.  Intravenous antibiotics were given.  A surgical time-out was held identifying correct patient and correct procedure.  An upper midline laparotomy incision was made excising the old scar.  The fascia was incised in the midline.  We entered the abdominal cavity.  There were lots of adhesions.  We had to spend about 20 minutes slowly taking all of the adhesions down.  We identified the stomach and the site where the previous gastrostomy was, where the stomach was densely adherent. We identified a transverse colon and we dissected that away from the stomach.  We actually had to divide a piece of omentum that was bridging them and tied off the ends of that.  Ultimately, after about 20 minutes, we had all of the adhesions taken down and we just had the old gastrostomy site.  We dissected the stomach away from the undersurface of the abdominal wall.  There was no hole or defect in the stomach.  I brought a 22-French Foley catheter with a 10-mL balloon to the operative field.  I made incision in the old gastrostomy site and brought that out and through into the abdominal cavity and the left upper quadrant.  We tested the balloon.  We placed  2 concentric purse-string sutures of 2-0 silk in the anterior wall of the stomach, generally in the same area where the previous gastrostomy tube had been placed.  We then used the cautery and a hemostat and gained entrance to the lumen of the stomach and visualized the mucosa.  We inserted the Foley catheter into the stomach and blew the balloon up.  The catheter flushed easily.  We tied both of the purse-string sutures.  We then pulled the stomach and the balloon up against the abdominal wall.  We tacked the stomach to the abdominal wall with about 6-7 interrupted sutures of 3-0 Vicryl.  This secured the gastrostomy tube to the peritoneum quite nicely.  We irrigated out the areas of dissection.   Couple of bleeders had been controlled with cautery, but there was no bleeding at the end of the case.  The midline fascia was closed with running suture of #1 PDS.  The skin was closed with skin staples.  Clean bandages were placed and the patient was taken tot he recovery room in stable condition.  Estimated blood loss was about 25 mL.  Complications were none.  Sponge, needle, and instrument counts were correct.     Angelia Mould. Derrell Lolling, M.D.     HMI/MEDQ  D:  02/10/2011  T:  02/10/2011  Job:  [id number]  cc:   Everardo All. Madilyn Fireman, M.D. Ines Bloomer, M.D.  Electronically Signed by Claud Kelp M.D. on 02/11/2011 10:56:20 AM

## 2011-02-16 NOTE — H&P (Addendum)
NAME:  Corey [name], Corey [name]                    ACCOUNT NO.:  [id number]  MEDICAL RECORD NO.:  [id number]           PATIENT TYPE:  O  LOCATION:  XRAY                         FACILITY:  [facility]  PHYSICIAN:  Michaeljames Milnes C. Madilyn Fireman, M.D.    DATE OF BIRTH:  1929/02/18  DATE OF ADMISSION:  02/07/2011 DATE OF DISCHARGE:                             HISTORY & PHYSICAL   CHIEF COMPLAINT:  Weight loss and dysphagia.  HISTORY OF PRESENT ILLNESS:  The patient is a 77 year old white male who is admitted for correction of severe dysphagia and nutritional problems after PEG attempt today was not technically feasible.  The patient has a known esophageal diverticulum that has grown gradually from at least 2004 until present with increasingly worsening dysphagia and regurgitation and displacement of the gastroesophageal junction eccentrically to a sharply angulated slit at the one-edge of the enlarging diverticulum.  He has had worsening nutritional status and weight loss and he had once required a surgically places feeding tube for other reasons when a PEG attempt was unsuccessful back in 2004.  He has had multiple food disimpactions and esophageal dilatations over the years.  However, as the diverticulum has enlarged, dilatations have become less effective and his nutritional status continues to suffer. On barium swallows in the past, there has also been absence of aperistalsis with achalasia-type appearance, although this has not been formally evaluated by manometry.  I sent him for a barium swallow 4 days ago and he aspirated promptly with the first set of barium and the procedure was terminated.  He has done fairly well over the weekend, but it became clear he needed nutritional supplementation by feeding tube and he was brought in for attempted PEG today.  There were several surgical scars over the upper abdomen in the usual location of PEG placement and I was not comfortable with the transillumination  achieved, but was even more concerned that the PEG bumper would very likely get hung up at the junction of the GE junction and the diverticulum and elected not to place the gastrostomy tube endoscopically.  At this point, the plan is for admission until feeding tube can be placed either surgically or percutaneously as well as for evaluation by thoracic surgeon for possible candidacy for eventual surgical repair of the diverticulum and/or achalasia, although given his age and multiple comorbidity, his candidacy for this would appear questionable.  PAST MEDICAL HISTORY:  History of arrhythmia, history of aortic insufficiency, history of aortic aneurysm status post repair, hypertension, hyperlipidemia, history of cardiac arrhythmias unspecified, and benign prostatic hypertrophy status post TURP.  PAST SURGICAL HISTORY:  Hernia repair, aneurysm surgery, and TURP.  FAMILY HISTORY:  Father had colon cancer.  SOCIAL HISTORY:  Denies alcohol or tobacco use.  ALLERGIES:  CT dye.  MEDICATIONS:  Zocor 80 mg a day, Monopril 20 mg a day, aspirin 81 mg a day, hydrochlorothiazide 12.5 mg once a day, calcium 600 mg b.i.d., Mucinex p.r.n., loratadine 10 mg once a day, and Toprol XL 25 mg once a day.  PHYSICAL EXAMINATION:  Thin, elderly white male in no acute distress. HEART:  Regular  rate and rhythm without murmur.  Abdomen is scaphoid with well-healed midline surgical scar, left upper quadrant oblique scar, and a smaller left upper quadrant oblique scar from his previous surgically placed gastrostomy tube placement.  No hepatosplenomegaly, mass, or guarding.  IMPRESSION:  Large esophageal diverticulum without achalasia with inability to maintain nutritional needs.  PLAN:  See above for plan of action as outlined in the history and physical section.  We will also get a chest x-ray in light of his recent aspiration of barium.          ______________________________ Everardo All Madilyn Fireman,  M.D.     JCH/MEDQ  D:  02/07/2011  T:  02/07/2011  Job:  [id number]  cc:   Dr. Janee Morn Dr. Karle Plumber  Electronically Signed by Dorena Cookey M.D. on 02/14/2011 07:10:48 PM

## 2011-02-16 NOTE — Op Note (Addendum)
  NAME:  Corey [name], [name]                    ACCOUNT NO.:  [id number]  MEDICAL RECORD NO.:  [id number]           PATIENT TYPE:  O  LOCATION:  XRAY                         FACILITY:  [facility]  PHYSICIAN:  Aariz Maish C. Madilyn Fireman, M.D.    DATE OF BIRTH:  1929-01-09  DATE OF PROCEDURE: DATE OF DISCHARGE:                              OPERATIVE REPORT   INDICATIONS FOR PROCEDURE:  Consideration of PEG placement in a patient with large complex esophageal diverticulum.  He was unable to meet nutritional needs.  PROCEDURE:  The patient was placed in the left lateral decubitus position and placed on the pulse monitor with continuous low-flow oxygen delivered by nasal cannula.  He was sedated with 100 mcg IV fentanyl and 5 mg IV Versed.  Olympus video endoscope was advanced under direct vision into the oropharynx and esophagus.  The esophagus was flaccid and dilated with a very large duodenal diverticulum in the lower esophagus. As on previously noted the GE junction was very slit-like and eccentrically located at the edge of the diverticulum and it was difficult to pass the scope through it, but eventually this was accomplished.  The stomach was entered.  A small amount of liquid secretions were suctioned from the fundus.  Retroflexed view of cardia was unremarkable.  The fundus, body, antrum and pylorus all appeared normal.  There was fairly good finger indentation over areas of the proximal stomach for PEG placement, but there was not good transillumination.  Due to concerns of the PEG bumper would get hung up in the diverticulum or the eccentrically located GE junction, I decided against trying to place the PEG tube.  The scope was then withdrawn and the patient returned to the recovery room in stable condition.  He tolerated the procedure well and there were no immediate complications.  IMPRESSION: 1. Large duodenal diverticulum. 2. PEG not performed due to technical considerations.  PLAN:  We will  bring the patient in the hospital for consideration of percutaneous versus surgical placement of feeding tube.  He has had the letter before.  We will also consider consultation with Cardiothoracic Surgery for eventual surgical treatment of his large duodenal diverticulum and possible achalasia.  We will also try to obtain modified barium swallow while he is here.          ______________________________ Everardo All. Madilyn Fireman, M.D.     JCH/MEDQ  D:  02/07/2011  T:  02/07/2011  Job:  [id number]  cc:   Gabrielle Dare. Janee Morn, M.D.  Electronically Signed by Dorena Cookey M.D. on 02/14/2011 07:10:51 PM

## 2011-02-18 NOTE — Consult Note (Signed)
NAME:  Corey [name], Corey [name]                    ACCOUNT NO.:  [id number]  MEDICAL RECORD NO.:  [id number]           PATIENT TYPE:  I  LOCATION:  3731                         FACILITY:  [facility]  PHYSICIAN:  Angelia Mould. Derrell Lolling, M.D.DATE OF BIRTH:  1929/04/28  DATE OF CONSULTATION: DATE OF DISCHARGE:                                CONSULTATION   TIME OF CONSULTATION:  1600 p.m.  REQUESTING PHYSICIAN:  John C. Madilyn Fireman, MD  PRIMARY CARDIOLOGIST:  Colleen Can. Deborah Chalk, MD  REASON FOR CONSULTATION:  Request for open G-tube placement.  HISTORY OF PRESENT ILLNESS:  Corey [name] is a 77 year old white male with a history of aortic insufficiency, hypertension, hyperlipidemia, cardiac arrhythmia.  The patient had a tracheostomy as well as open G-tube placed after AAA repair in 2004.  His G-tube was subsequently discontinued after it became infected and no longer required for use.  However, the patient did develop dysphasia as well as some achalasia after his tracheostomy. He has had multiple EGDs as well as dilatations.  The patient has recently lost over 30 pounds due to an inability to swallow.  The patient was admitted by Dr. Madilyn Fireman several days ago.  A barium swallow was ordered, which revealed severe achalasia as well as a large esophageal diverticulum.  Dr. Madilyn Fireman attempted to place a PEG tube endoscopically; however, was unsuccessful due to this large diverticulum.  Interventional Radiology was asked to place a percutaneous G-tube; however, due to the position and malrotation of the stomach they were unable to place this as well.  Therefore, we have been asked to evaluate the patient for placement of open G-tube.  REVIEW OF SYSTEMS:  Please see HPI.  Otherwise all other systems have been reviewed and are negative.  FAMILY HISTORY:  Noncontributory.  PAST MEDICAL HISTORY: 1. Aortic insufficiency. 2. Hypertension. 3. Hyperlipidemia. 4. 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 5. BPH. 6. History of tobacco  use; however, stopped over 20 years ago.  PAST SURGICAL HISTORY: 1. AAA repair. 2. Bilateral inguinal hernia repairs. 3. Open G-tube placement. 4. TURP.  SOCIAL HISTORY:  The patient is married for over 50 years.  He has 4 children, 3 girls and 1 boy.  He is retired.  He stopped drinking alcohol in 1978 and stopped smoking cigarettes in 1985.  ALLERGIES:  IV CONTRAST DYE.  MEDICATIONS AT HOME: 1. Zocor 80 mg a day. 2. Monopril 20 mg daily. 3. Aspirin 81 mg daily. 4. Hydrochlorothiazide 12.5 mg daily. 5. Calcium 600 mg b.i.d. 6. Mucinex p.r.n. 7. Loratadine 10 mg daily. 8. Toprol-XL 25 mg daily.  PHYSICAL EXAMINATION:  GENERAL:  Corey [name] is a very pleasant yet very thin and deconditioned 77 year old white male who is currently lying in bed in no acute distress. VITAL SIGNS:  Temperature 97.8, pulse 73, respirations 20, blood pressure 118/56. HEART:  Regular rate and rhythm.  Normal S1 and S2.  No murmurs, gallops, or rubs are noted.  He does have palpable carotid, radial, and pedal pulses bilaterally. LUNGS:  Clear to auscultation bilaterally with no wheezes, rhonchi, or rales noted.  Respiratory effort is nonlabored. ABDOMEN:  Soft, nontender,  and nondistended.  Active bowel sounds.  He does have upper midline scar from his prior open G-tube as well as a punctate scar in the left upper quadrant from his prior G-tube.  No masses, hernias, or organomegaly are palpable. MUSCULOSKELETAL:  All four extremities are symmetrical with no cyanosis, clubbing, or edema. PSYCH:  The patient is alert and oriented x3 with an appropriate affect.  LABORATORY DATA:  White blood cell count is 11,900, hemoglobin 12.3, hematocrit 36.6, platelet count is 188,000, INR is 1.16.  Sodium 141, potassium 3.9, glucose 93, BUN 11, creatinine 0.93, total bilirubin 0.8, alkaline phosphatase 61, AST 14, ALT 30.  DIAGNOSTICS:  Two-view chest x-ray reveals no evidence of aspirated barium, residual  barium was within the esophagus and within the distal esophageal diverticulum.  Barium swallow reveals slight aspiration of barium into the left greater than right main stem lower lobe rhonchi. There is also marked dysmotility through the thoracic esophagus consistent with severe achalasia.  There is a large 5-6 cm left posterior diverticulum noted of the esophagus.  IMPRESSION: 1. Dysphasia. 2. Achalasia. 3. Esophageal diverticulum. 4. Aortic insufficiency with an ejection fraction in 2004 of 40% to     50%. 5. Premature ventricular contractions. 6. Hypertension. 7. Hyperlipidemia. 8. History of abdominal aortic aneurysm. 9. Protein-calorie malnutrition  PLAN:  At this time, I have discussed this patient with Dr. Derrell Lolling.  We will have Cardiology evaluate the patient to assure that he is safe for surgical intervention including general endotracheal anesthesia.  We will plan for an open G-tube as long as the patient is cleared by Cardiology.  Hopefully we will be able to plan for this procedure within the next couple days depending on any further workup that may be needed from a cardiac standpoint.  I have discussed this with the patient as well as his wife who understands.  In the meantime, the patient may continue his pureed diet per Dr. Madilyn Fireman, we will make him n.p.o. after midnight in case anything can be done tomorrow.     Letha Cape, PA   ______________________________ Angelia Mould. Derrell Lolling, M.D.    KEO/MEDQ  D:  02/08/2011  T:  02/09/2011  Job:  [id number]  cc:   Everardo All. Madilyn Fireman, M.D. Colleen Can. Deborah Chalk, M.D.  Electronically Signed by Barnetta Chapel PA on 02/15/2011 61:60:73 PM Electronically Signed by Claud Kelp M.D. on 02/18/2011 09:10:33 AM

## 2011-02-19 NOTE — Discharge Summary (Signed)
  NAME:  Corey [name], Corey [name]                    ACCOUNT NO.:  [id number]  MEDICAL RECORD NO.:  [id number]           PATIENT TYPE:  I  LOCATION:  3713                         FACILITY:  [facility]  PHYSICIAN:  Willis Modena, MD     DATE OF BIRTH:  February 15, 1929  DATE OF ADMISSION:  02/07/2011 DATE OF DISCHARGE:  02/14/2011                              DISCHARGE SUMMARY   CONSULTATIONS: 1. Interventional Radiology. 2. General Surgery.  ADMISSION DIAGNOSES: 1. Failure to thrive. 2. Dysphagia. 3. Nausea, vomiting.  DISCHARGE DIAGNOSIS:  Failure to thrive with achalasia and distal esophageal diverticulum.  PROCEDURES PERFORMED:  Surgical placement of gastrotomy tube.  HISTORY OF PRESENT ILLNESS:  Corey [name] is a 77 year old gentleman admitted to [facility] for failure to thrive, nausea, and vomiting.  HOSPITAL COURSE:  The patient had an endoscopy that showed a large distal esophageal diverticulum which precluded ability to place an endoscopic PEG tube.  The procedure was likewise technically not possible after attempts by Interventional Radiology.  The patient ultimately underwent surgical placement of the G-tube by Dr. Derrell Lolling. The patient tolerated the procedure well, and we were ultimately able to titrate up his PEG tube feeds to a bolus situation.  We converted most of his medications to be able to be crushed and administered via his PEG tube.  DISCHARGE MEDICATIONS: 1. Aspirin 81 mg per PEG tube daily. 2. Hydrochlorothiazide 12.5 mg per PEG tube b.i.d. 3. Lisinopril 20 mg per PEG tube daily. 4. Metoprolol 12.5 mg per PEG tube twice a day. 5. Guaifenesin 10 mL per PEG tube q.4 h. as needed. 6. Restoril 15 mg per PEG tube at night as needed.  DISCHARGE DIET: 1. He will be given a dysphagia II diet for pleasure of eating only. 2. His nutrition will be Jevity 1.2, one can six times per day     followed by free water flushes.  DISCHARGE FOLLOWUP: 1. He will follow up with Dr. Derrell Lolling  in the next week or two for     surgical suture/staple removal. 2. Our office will contact Corey [name] to make followup in our GI our     office.  DISCHARGE INSTRUCTIONS:  The patient was instructed to seek medical care for any worsening abdominal pain, fevers, blood in stool, or any other troublesome symptoms.  I had a long extensive discussion with Mr. and Corey [name], and they feel comfortable with discharge, as home health will be assisting with getting all of the tube feeds and such set up.  Greater than 45 minutes was spent in direct patient counseling and coordination of care of discharge.  Thanks again for allowing to participate in Corey [name]' care.     Willis Modena, MD     WO/MEDQ  D:  02/14/2011  T:  02/15/2011  Job:  [id number]  Electronically Signed by Willis Modena  on 02/19/2011 09:05:44 PM

## 2011-04-25 NOTE — H&P (Signed)
NAME:  Corey [name], Corey [name]                    ACCOUNT NO.:  [id number]   MEDICAL RECORD NO.:  [id number]          PATIENT TYPE:  EMS   LOCATION:  MAJO                         FACILITY:  [facility]   PHYSICIAN:  Bernette Redbird, M.D.   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   Corey [name] is a patient of my partner, Dr. Dorena Cookey.  Some years ago he  had a food impaction with a hot dog and has had some degree of dysphagia  symptoms since then.  In fact, 3 months ago he underwent endoscopic  dilatation of the esophagus to 17 mm by Savary technique by Dr. Dorena Cookey in our office endoscopy unit.   With that background, for the past day or two, the patient has had  trouble with food going down and finally presented to the emergency room  this evening when they called me on call and indicated that even  attempts to drink liquids such as tea were unsuccessful.   In the emergency room, the patient underwent endoscopic evaluation with  the intent of this impaction of the food bolus.  What we found was a  tortuous distal esophagus with a large amount of rather amorphous food  debris, including vegetable matter, impacted distally.  There was also a  distal esophageal diverticulum, as had been previously noted.  Prolonged  attempts (1 hour) allowed [place] to only retrieve very small fractions of  the inspissated food.  We used two different scopes (standard and  therapeutic) and several different retrieval instruments including the  [facility] retrieval basket, the tripod forceps, and polypectomy snare.  The  main problem seemed to be that the esophagus was too tortuous to get a  good direct approach to the food debris with the instruments,  plus the  food debris itself was too amorphous to be readily grasped.   At one point, the scope successfully entered the stomach and duodenum  but when I pulled back into the esophagus, the residual food in the  distal esophagus basically reobstructed the opening to the stomach and  the stomach could never be reentered.   The patient was somewhat combative during the procedure, consistent with  his history of a paradoxical reaction to sedation as mentioned by his  wife.   Due to the unsuccessful character of the exam, despite prolonged  attempts, it was felt that it was best to probably try the procedure  under general anesthesia after a period of overnight observation.   ALLERGIES:  IVP DYE.   OUTPATIENT MEDICATIONS:  1. Zocor 80 mg each morning.  2. Monopril 20 mg each morning.  3. HCTZ 12.5 mg each morning.  4. Humibid once daily.  5. Claritin.  6. Vitamin C.   OPERATION/PROCEDURE:  1. Remote hernia repair.  2. Repair of  an abdominal aortic aneurysm in 2004.   PAST MEDICAL HISTORY:  1. Abnormal heart rhythm.  2. Hypertension.  3. Some mild COPD.  4. No diabetes.  5. No history of MI.  6. He also has the history of apparently some aortic insufficiency.  7. History of the above-mentioned food impaction.   HABITS:  The patient is alcoholic, but has been sober for the past 20  years per the patient's family.  He quit smoking about 20 years ago.   FAMILY HISTORY:  Pertinent for colon cancer in his father.  Colonoscopy  by Dr. Madilyn Fireman in May of this year was negative for polyps.   SOCIAL HISTORY:  Married, still works as a retired or semi retired  Veterinary surgeon.   REVIEW OF SYSTEMS:  Not obtained in detail at this time.   PHYSICAL EXAMINATION:  GENERAL:  Prior to sedation, the patient was  alert, oriented, coherent and appropriate, very pleasant.  SKIN:  Rather atrophic.  HEENT:  He is anicteric without overt pallor.  CHEST:  The chest was clear and actually had reasonable  breath sounds  even though he has a barrel chest configuration consistent with COPD.  HEART:  The heart was normal.  ABDOMEN:  The abdomen was somewhat scaphoid and without guarding, mass  or tenderness.   IMPRESSION:  1. Unsuccessful attempt at endoscopic remedy of food impaction.  2. History of dysphagia, esophageal ring and distal esophageal      diverticulum.  3. Family history of colon cancer with negative recent colonoscopy.  4. History of postoperative delirium corpora, paradoxical reaction to      sedation per the patient's wife  5. History of hypertension.  6. Some mild chronic obstructive pulmonary disease.  7. Status post AAA repair.  8. History of aortic insufficiency.   PLAN:  The patient will be given IV fluids overnight and in all  likelihood, repeat attempts at food disimpaction, probably under general  anesthesia in the operating room, will be undertaken tomorrow.           ______________________________  Bernette Redbird, M.D.     RB/MEDQ  D:  08/07/2007  T:  08/09/2007  Job:  [id number]   cc:   Larina Earthly, M.D.  Colleen Can. Deborah Chalk, M.D.  John C. Madilyn Fireman, M.D.

## 2011-04-25 NOTE — Op Note (Signed)
NAME:  Corey [name], Corey [name]                    ACCOUNT NO.:  [id number]   MEDICAL RECORD NO.:  [id number]          PATIENT TYPE:  INP   LOCATION:  0006                         [facility]ACILITY:  [facility]   PHYSICIAN:  Sigmund I. Patsi Sears, M.D.DATE OF BIRTH:  1929/08/01   DATE OF PROCEDURE:  12/26/2007  DATE OF DISCHARGE:                               OPERATIVE REPORT   PREOPERATIVE DIAGNOSIS:  Massive benign prostatic hypertrophy.   POSTOPERATIVE DIAGNOSIS:  Same.   OPERATION:  Cystourethroscopy, transurethral resection of the prostate.   SURGEON:  Sigmund I. Patsi Sears, M.D.   ANESTHESIA:  Spinal.   PREPARATION:  After appropriate pre-anesthesia, the patient was brought  to the operating room, placed on the operating room table in the right  lateral decubitus position, where a spinal anesthetic was introduced.  He was then placed in dorsolithotomy position where the pubis was  prepped with Betadine solution and draped in the usual fashion.   BRIEF HISTORY:  This 77 year old male has a history of chronic urinary  retention, failing multiple voiding trials despite alpha blocker  therapy.  The patient has a history of residuals of 844 mL and 723 mL,  with the prostate ultrasound showing a volume of 91.25 mL.  The patient  was counseled by myself and Dr. Laverle Patter via second opinion, to proceed  with a TURP for his chronic urinary obstruction.  We discussed the  suprapubic tube placement at the time, but as noted below at this time  of surgery it was felt that a suprapubic tube was not in the patient's  best interest because of his extensive abdominal incision to the level  of pubic tubercle, exposure of bowel to possible injury with a  suprapubic placement.   PROCEDURE:  Cystourethroscopy revealed trilobar BPH with trabeculation,  cellule formation.  The patient had possible early diverticular  formation as well.  This was photo documented.  Resection was begun at  the 7 o'clock position, carried to  the 5 o'clock position.  Following  this, resection was begun at the 11 o'clock position, carried to the 6  o'clock position, and then from the 1 o'clock to the 5 o'clock  positions.  The patient had extensive coagulation of bleeding points.  All chips were evacuated free from the bladder and sent to the  laboratory for examination.   A 24, plastic three-way Foley catheter was placed to traction and  irrigated reasonably clear.  The patient was then taken to the recovery  room in good condition.  He had a B&O suppository placed at the end of  the procedure.  We will check a serum sodium in the recovery room.      Sigmund I. Patsi Sears, M.D.  Electronically Signed     SIT/MEDQ  D:  12/26/2007  T:  12/26/2007  Job:  [id number]   cc:   Colleen Can. Deborah Chalk, M.D.  Fax: [phone]   Oretha Milch, MD  [address] [place] [address]

## 2011-04-25 NOTE — Op Note (Signed)
NAME:  Corey [name], Corey [name]                    ACCOUNT NO.:  [id number]   MEDICAL RECORD NO.:  [id number]          PATIENT TYPE:  INP   LOCATION:  4729                         FACILITY:  [facility]   PHYSICIAN:  Corey [name], M.D.    DATE OF BIRTH:  Aug 25, 1929   DATE OF PROCEDURE:  DATE OF DISCHARGE:  08/08/2007                               OPERATIVE REPORT   PROCEDURE:  EGD with Botox injection and food disimpaction.   INDICATIONS FOR PROCEDURE:  Dysphagia.  Food impaction, unable by Dr.  Matthias [name] to clear.  Consent was signed after risks, benefits, methods and  options thoroughly discussed with the patient and his wife and his son  on multiple occasions.  No sedation was given.   PROCEDURE IN DETAIL:  First, we inserted the peds endoscope because  there is a question whether he was swallowing better; maybe the food  impaction had passed.  His esophagus was normal in the distal but  tortuous with some spasm.  In the distal esophagus was an obvious  moderate to large diverticulum moderately filled with food and, just  next to that, his esophagus which had the appearance of achalasia with  the customary spasm.  With gentle pressure, we could advance the peds  scope into the stomach which we did on multiple occasions.  We evaluated  the food left in the diverticulum.  The esophagus was normal.  We went  ahead and snared the bigger pieces and cut through them on multiple  occasions but, because we were using the peds scope and he was  unsedated, we could not use any other bar devices to try to remove the  food from the diverticulum.  We went ahead and withdrew the peds scope  and then we re-discussed the case with both him and his wife and his son  since that it did have an achalasia appearance.  We discussed Botox  injection and going back again and removing the food from the  diverticulum which is what we did.  The endoscopic appearance was  unchanged.  We went ahead and injected Botox into the GE  junction in the  customary fashion, four injections of 25 units each, 1 mL each, in the  customary fashion.  We then went ahead and used a Corey [name] retrieval net and  were able to retrieve multiple 1-cm pieces on the average, for  approximately 5 to 10 times, withdrawing the food each time.  Photo  documentation was done right before the last two Corey [name] retrieval nets and  there was only minimal debris left.  Unfortunately we could not suction  it up through the scope.  Elevating the head of his bed and washing and  sectioning was done to try to rinse some of the debris out.   The patient tolerated the procedure well through all this.  We did, at  one point, insert the scope into the stomach and quick look at the  stomach on strained retroflexed visualization  did reveal some antral  erosions.  The pylorus, duodenal bulb and second portion of the  duodenum  were all normal.  No other abnormalities were seen after the final Corey [name]  net debris was removed; we elected not to reinsert any further.  The  patient tolerated the procedure well.  There was no obvious immediate  complication.   ENDOSCOPIC DIAGNOSES:  1. Using the pediatric scope, the esophagus had a distal diverticulum,      moderately filled with food; status post snare and broke through      some of the bigger pieces.  2. Gastroesophageal junction had the achalasia appearance.  3. Proximal stomach okay.  Able to advance the pediatric easily      through the gastroesophageal junction.  We then passed a regular      scope and injected Botox as above in the customary fashion.  4. Multiple Roth net retrieval baskets with multiple pieces removed      from the diverticulum.  5. Antral erosions.  6. Otherwise, normal esophagogastroduodenoscopy.   PLAN:  Go ahead and put him on clear liquids.  If fine after lunch, can  go home.  We will go ahead and give him some Prevacid SoluTab for the  erosions and follow up with Dr. Madilyn [name] in two to four  weeks just to check  the symptoms and make sure no further work-up plans are needed.  He  knows to call us sooner p.r.n.  recurrent swallowing problems, pain,  fever, et Karie Soda and we will review everything with the family.           ______________________________  Corey [name], M.D.     MEM/MEDQ  D:  08/08/2007  T:  08/08/2007  Job:  [id number]   cc:   Corey All. Corey [name], M.D.  Corey [name], M.D.  Corey [name], M.D.

## 2011-04-25 NOTE — Discharge Summary (Signed)
NAME:  Corey [name], Corey [name]                    ACCOUNT NO.:  [id number]   MEDICAL RECORD NO.:  [id number]          PATIENT TYPE:  INP   LOCATION:  1404                         FACILITY:  [facility]   PHYSICIAN:  Sigmund I. Patsi Sears, M.D.DATE OF BIRTH:  1929-06-14   DATE OF ADMISSION:  12/27/2007  DATE OF DISCHARGE:  12/29/2007                               DISCHARGE SUMMARY   FINAL DIAGNOSES:  1. Chronic urinary retention (pathology pending).  2. History of anxiety.  3. History of cardiac failure.  4. History of heart disease.  5. History of elevated cholesterol.  6. History of hypertension.  7. History of rhythm disorder.  8. History of surgery for abdominal aortic aneurysm.   OPERATION:  Operation this admission took place on January 16; the  operation was cystoscopy and transurethral resection of the prostate.   HISTORY:  Corey [name] is a 77 year old married white male, with recurrent  acute urinary retention, failing medication therapy and multiple voiding  trials with an alpha blocker therapy.  Prostate ultrasound showed a vial  of 91.25 mL, with postvoid residuals of 723, and 844 mL.  PSA was 0.13  with a free fraction of 15%.  After all forms of treatment for BPH were  discussed with the patient, he selected TURP.  He had a second opinion  with Dr. Heloise Purpura, who agreed with plans for TURP.  The patient was  admitted via the operating room for surgical procedure, after holding  his aspirin for 5 days prior to surgical procedure.   LABORATORY DATA:  Sodium 141, potassium 4.5, chloride 107, CO2 28, BUN  18, creatinine 1.18, with INR of 1, PTT of 31.   IMAGING STUDY:  The patient had chest x-ray showing COPD, torturous  aorta, and hiatal hernia, but no active disease.   HOSPITAL COURSE:  On January 15, the patient underwent cystoscopy and  TURP.  The patient was treated postoperatively with continuous bladder  irrigation.  He had laboratories checked in recovery room, showing  sodium  of 141, with a hemoglobin of 14.2, hematocrit 41.4, falling to  hemoglobin of 12.9, and hematocrit of 37.4.  The patient was  hemodynamically stable.  The gross hematuria began to clear on the  postop day #1, and discharge  planning is to remove the Foley catheter on postop day #2 with discharge  on Septra double strength 1 b.i.d., and p.r.n. Darvocet for pain.  The  patient's prescriptions were written, he will be reevaluated by Dr.  Vonita Moss on weekend call.  The patient was discharged in stable  condition.      Sigmund I. Patsi Sears, M.D.  Electronically Signed     SIT/MEDQ  D:  12/27/2007  T:  12/28/2007  Job:  [id number]

## 2011-04-25 NOTE — H&P (Signed)
NAME:  Corey [name], Corey [name]                    ACCOUNT NO.:  [id number]   MEDICAL RECORD NO.:  [id number]          PATIENT TYPE:  INP   LOCATION:  0006                         FACILITY[facility]   PHYSICIAN:  Sigmund I. Patsi Sears, M.D.DATE OF BIRTH:  [birth year]   DATE OF ADMISSION:  12/26/2007  DATE OF DISCHARGE:                              HISTORY & PHYSICAL   HISTORY OF PRESENT ILLNESS:  Mr. Turnley is a 77 year old male, seen  originally with urinary retention.  The patient has had multiple  episodes now of urinary retention, despite treatment with alpha blocker  therapy (Flomax), as well as medication to try to shrink his prostate  (Avodart).  The patient has had residuals 844 mL, and 723 mL, with a  prostate ultrasound showing a volume of 91.25 mL.   Following extensive consultation, including second opinion with Dr.  Laverle Patter, all treatments were considered for him, including open  prostatectomy, TURP, office procedures such as Prolief or TUNA therapy.  He was counseled and has elected to have TURP.  I have considered  suprapubic catheterization for the patient.   PAST MEDICAL HISTORY:  Anxiety, heart failure, heart disease, elevated  cholesterol, hypertension, and rhythm disorders and abdominal aneurysm  surgery.   CURRENT MEDICATIONS:  Include aspirin 81 mg daily, calcium plus vitamin  D, ciprofloxacin, Flomax, Humibid, hydrochlorothiazide, 12.5 mg daily,  Monopril 20 mg daily, vitamin C, and Zocor.   ALLERGIES:  IV CONTRAST.   FAMILY HISTORY:  Significant for several children deceased.   SOCIAL HISTORY:  Occupation:  The patient is currently a retired Estate agent. Tobacco history:  80-pack-year history, but none times 25  years.   CONSTITUTIONAL REVIEW OF SYSTEMS:  Shows that the patient has urinary  frequency, feelings of urgency, dysuria, nocturia, incontinence,  hesitancy, intermittency, incomplete emptying, postvoid dribbling, but  no hematuria, no discharge, no  foul-smelling urine, no oliguria and no  pelvic pain.  He does have a history of leg swelling.  His remaining  constitutional review of systems is negative for skin, eyes, ears,  hematologic, pulmonary, endocrine, musculoskeletal, GI, neurologic or  psychiatric problems.   PHYSICAL EXAMINATION:  VITAL SIGNS:  Per anesthesia record.  NECK:  Supple, nontender nodes.  CHEST:  Clear to P&A although the patient has distant breath sounds.  ABDOMEN:  Soft, positive bowel sounds without organomegaly, without  masses.  The patient has chest and abdominal incisions, which extend  from the xiphoid to the pubis.  He is well-healed and has no tenderness.  Bladder is not palpable. Foley catheter is in place.  Abdomen is benign.  There is no flank pain.  No masses.  The liver is not palpable.  GU: Shows normal circumcised penis with Foley catheter in place.  Scrotum is normal.  Testicles were normal bilaterally.  The epididymis  is normal bilaterally.  RECTAL:  Shows normal perineum with normal sphincter tone.  Prostate is  4+ lobular benign.  No masses, no edema, no masses and no blood.  EXTREMITIES:  Show no peripheral edema.  The patient has palpable pulses  bilaterally  in his lower extremities.  PSYCHIATRIC:  Shows normal orientation to time, person, place.   IMPRESSION:  This patient is to be admitted to the operating room for  TURP.      Sigmund I. Patsi Sears, M.D.  Electronically Signed     SIT/MEDQ  D:  12/26/2007  T:  12/26/2007  Job:  [id number]   cc:   Colleen Can. Deborah Chalk, M.D.  Fax: [phone]   Larina Earthly, M.D.  Fax: [phone]

## 2011-04-25 NOTE — Op Note (Signed)
NAME:  Corey [name], Corey [name]                    ACCOUNT NO.:  [id number]   MEDICAL RECORD NO.:  [id number]          PATIENT TYPE:  EMS   LOCATION:  MAJO                         FACILITY:  [facility]   PHYSICIAN:  Bernette Redbird, M.D.   DATE OF BIRTH:  04/15/1929   DATE OF PROCEDURE:  08/08/2007  DATE OF DISCHARGE:                               OPERATIVE REPORT   PROCEDURE:  Upper endoscopy with foreign body removal.   INDICATIONS:  77 year old gentleman with prior history of food impaction  some years ago, and status post esophageal dilatation about three months  ago for recurrent dysphagia symptoms, who presents with a roughly 24  hour history of being unable to get food down.   FINDINGS:  Distal food impaction partially relieved but unable to get  most of the food out.  The patient agitated and combative during exam.  Distal esophageal diverticulum.   DESCRIPTION OF PROCEDURE:  The procedure was familiar to the patient who  provided written consent.  It was done at the bedside in the emergency  room.  The patient was quite combative during this procedure.  We kept  giving more and more sedation, but to no avail.  We even gave Phenergan  toward the end, but it did not seem to help.  The sedation totaled  fentanyl 100 mcg, Versed 10 mg, and Phenergan 12.5 mg IV in this 77-year-  old patient, but despite all that, while the scope was down, he was  combative and several people had to try to hold him down.  This  procedure lasted an hour and the majority of food in the esophagus was  never able be removed.   The procedure was initiated with the standard Pentax adult video  endoscope which was passed easily under direct vision.  The larynx  looked grossly normal.  The proximal esophagus was normal.  The distal  esophagus was tortuous.  There was a distal esophageal diverticulum as  had been noted on previous endoscopy.  Distally, there was a lot of food  and it was amorphous debris and vegetable  matter.  It was not like one  big chunk of meat which could be easily engaged and extracted.   I tried several different instruments for removal of the food debris,  including a Roth retrieval basket, the tripod forceps, and a standard  snare.  These were successful in extracting tiny bits of food debris but  I could not really engage any large clump of food at any one given time.  Thus, despite bringing the scope in and out of the esophagus 10 or 15  times with small pieces of food, the residual food debris continued to  plug the distal esophagus.   Almost by accident, the scope slipped into the stomach at one point.  The antrum of the stomach had some patchy erythema but no erosive  changes, ulcers, polyps or masses and a retroflexed view of the cardia  was unremarkable.  Specifically, there was no evidence of a tumor in the  cardia.  A minimal hiatal hernia was present.  The pylorus, duodenal  bulb and second duodenum looked normal.   Thereafter, when I pulled scope back into the esophagus, and tried to  irrigate the food through the esophageal opening into the stomach, the  food basically re-impacted and I could never enter the stomach again.  Further attempts to get the food out or push it into the stomach were  unsuccessful.  Unfortunately, the procedure was made more difficult by  the patient's tortuous distal esophagus which made deployment of  instruments difficult because we would loose visualization as we had to  back the scope off to deploy the instruments.   After an hour of attempting and switching to the Pentax double channel  therapeutic scope which really offered no better success then its  smaller standard predecessor, it was felt that further attempts would  probably be fruitless but might expose the patient to increased risks of  complications such as esophageal perforation, especially since he was  climbing up on the bed, unable to be fully restrained, flipping  onto his  back, so forth.  Accordingly, the procedure was terminated.  There were  no apparent complications from this procedure.   IMPRESSION:  1. Food impaction, unable to be adequately removed during this exam.  2. Tortuous distal esophagus with esophageal ring present.  3. Distal esophageal diverticulum.  4. Transient visualization of the stomach and duodenum with antral      gastritis noted.  5. No tight esophageal stenosis or ring was appreciated during this      exam.  It is felt that the food impaction might be due to a      combination of factors largely due to the patient's anatomy with a      tortuous distal esophagus as well as some degree of anatomic      esophageal narrowing.   PLAN:  The patient is being brought into the hospital on observation  status with the intent of a repeat attempt at food disimpaction  tomorrow, probably under general anesthesia in the operating room.  I  have explained these findings and plans with the patient's wife and son.           ______________________________  Bernette Redbird, M.D.     RB/MEDQ  D:  08/08/2007  T:  08/08/2007  Job:  [id number]   cc:   Larina Earthly, M.D.  Colleen Can. Deborah Chalk, M.D.  John C. Madilyn Fireman, M.D.

## 2011-04-28 NOTE — Op Note (Signed)
NAME:  Corey [name], Corey [name]                              ACCOUNT NO.:  [id number]   MEDICAL RECORD NO.:  [id number]                   PATIENT TYPE:  INP   LOCATION:  2311                                 FACILITY:  [facility]   PHYSICIAN:  Balinda Quails, M.D.                 DATE OF BIRTH:  24-Apr-1929   DATE OF PROCEDURE:  10/08/2003  DATE OF DISCHARGE:                                 OPERATIVE REPORT   PREOPERATIVE DIAGNOSIS:  Ischemic left leg, status post aortobi-iliac bypass  grafting.   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   PROCEDURES:  Left aortofemoral bypass with 8 mm Hemashield graft.   SURGEON:  Balinda Quails, M.D.   ASSISTANT:  Pecola Leisure, P.A.   ANESTHETIC:  General endotracheal.   ANESTHESIOLOGIST:  Quita Skye. Krista Blue, M.D.   CLINICAL NOTE:  Mr. Cruzan is a 77 year old male who earlier today underwent  extensive abdominal aortic and iliac aneurysm repair with aortobi-iliac  grafting.  Initially in the surgical intensive care unit, his left leg was  well perfused.  There was, however, a loss of pulse in the left leg and no  audible Doppler signals with ischemic changes noted.  He is brought back to  the operating room at this time for reexploration.   OPERATIVE PROCEDURE:  The patient was brought to the operating room,  intubated, and ventilated.  Placed under general endotracheal anesthesia.  The abdomen and both legs were prepped and draped in a sterile fashion in  the supine position.   The left groin was opened through a longitudinal skin incision.  The  subcutaneous tissues and lymphatics were divided with electrocautery.  Branches of the saphenous vein were ligated with 3-0 silk and divided.  The  left common femoral artery was mobilized up to the inguinal ligament.  There  was evidence of dissection of the left external iliac artery with evidence  of subintimal blood.  The left femoral pulse was noted to be quite  weak.  The left common femoral artery was encircled with a vessel loop at the  inguinal ligament.  Distal dissection was carried down to expose the  profunda and superficial femoral arteries, which was encircled with vessel  loops.  The origin of the left superficial femoral artery appeared normal.   The abdomen was then reopened through the midline incision.  The fascial  sutures were removed.  Blood and fluid were suctioned from the abdominal  cavity.  The left limb of the aortobi-iliac graft was exposed and encircled  with a vessel loop.  The patient was administered 5000 units of heparin  intravenously.  The left limb of the aortofemoral graft was then controlled  proximally with a Fogarty clamp.  The left external iliac artery was suture  ligated with a 4-0 Prolene suture.  The left aortoiliac anastomosis was  taken down.  The left limp of the aortofemoral graft was divided  transversely.  A new segment of 8 mm Dacron was then anastomosed end to end  to the left limb of the aortic graft using running 5-0 Prolene suture.  This  was then brought through a retroperitoneal tunnel to the left groin.  The  left femoral vessels were controlled with clamps.  A longitudinal  arteriotomy was made in the distal left common femoral artery extending out  onto the origin of the left superficial femoral artery.  A 3 Fogarty was  passed down both the profunda and superficial femoral arteries.  The Fogarty  was passed the entire length of the leg.  No thrombus was returned.   The left aortofemoral graft was then beveled and anastomosed end to side to  the left common femoral artery using running 5-0 Prolene suture.  At  completion of the left aortofemoral anastomosis, the graft was flushed,  clamps removed, and the left leg reperfused.   Adequate hemostasis was obtained.  The patient was administered 30 mg of  protamine intravenously.   The left groin incision was irrigated with antibiotic  solution.  The left  groin incision was then closed with two layers of running 2-0 Vicryl suture  for subcutaneous tissue and staples applied to the skin.   The abdomen was then reexamined to ensure there were no retained instruments  or sponges.  The retroperitoneum was reapproximated over the left  aortofemoral limb using running 3-0 Vicryl suture.   The midline fascia was then closed at the xiphoid with interrupted figure-of-  eight #1 Ethibond suture.  The fascia was closed with running #1 PDS suture.  The skin was closed with staples.  Sterile dressings were applied.   The patient was hemodynamically stable and transferred back to the surgical  intensive care unit.  Palpable posterior tibial pulses present in the lower  extremities bilaterally.                                               Balinda Quails, M.D.    PGH/MEDQ  D:  10/08/2003  T:  10/09/2003  Job:  [id number]

## 2011-04-28 NOTE — Op Note (Signed)
NAME:  Corey [name], Corey [name]                              ACCOUNT NO.:  [id number]   MEDICAL RECORD NO.:  [id number]                   PATIENT TYPE:  INP   LOCATION:  2311                                 FACILITY:  [facility]   PHYSICIAN:  Balinda Quails, M.D.                 DATE OF BIRTH:  08-Aug-1929   DATE OF PROCEDURE:  10/15/2003  DATE OF DISCHARGE:                                 OPERATIVE REPORT   PREOPERATIVE DIAGNOSIS:  Ventilator dependent respiratory failure.   POSTOPERATIVE DIAGNOSIS:  Ventilator dependent respiratory failure.   OPERATION PERFORMED:  1. Tracheostomy.  2. Insertion of right subclavian triple lumen central venous catheter.   SURGEON:  Balinda Quails, M.D.   ASSISTANT:  Eber Jones A. Eustaquio Boyden.   ANESTHESIA:  General endotracheal.   ANESTHESIOLOGIST:  Germaine Pomfret, M.D.   INDICATIONS FOR PROCEDURE:  [name] is a 77 year old gentleman status post  abdominal aortic aneurysm repair.  He has failed two previous attempts at  extubation.  Brought to the operating room at this time for elective  tracheostomy.   DESCRIPTION OF PROCEDURE:  The patient was brought to the operating room in  stable condition.  Placed in supine position.  The neck was prepped and  draped in the sterile fashion.  Skin and subcutaneous tissue were instilled  with 1% Xylocaine with epinephrine.  Transverse skin incision was made  across the neck at midline.  Dissection carried down through subcutaneous  tissue.  Strap muscles split.  The isthmus of the thyroid divided.  Second  and third tracheal rings exposed.  The second tracheal ring encircled with 3-  0 Prolene suture.  The endotracheal tube balloon deflated and the  endotracheal tube slowly pulled back.  The trach opened through a trap door  type incision through the second tracheal ring.  A #8 Shiley tracheostomy  tube inserted without difficulty.  Patient ventilated immediately without  desaturation.  The balloon inflated  appropriately.  The tracheostomy tube  fixed to the skin with interrupted 3-0 nylon suture.  Tracheostomy ribbon  placed around the neck.  Tracheostomy dressing applied.   The right shoulder and chest were then prepped and draped in sterile  fashion.  A needle was easily introduced to the right subclavian vein.  A  0.035 J-wire passed through the needle into the superior vena cava.  The  needle removed and the dilator passed over the guidewire.  Triple lumen  catheter passed over the guidewire and positioned in the  superior vena cava.  The guidewire removed.  The catheter flushed with  saline solution.  Catheter fixed to the skin with interrupted 2-0 silk  suture.  Sterile dressings applied.  There were no apparent complications.  Patient transferred back to the surgical intensive care unit for chest x-  ray.  Balinda Quails, M.D.    PGH/MEDQ  D:  10/15/2003  T:  10/16/2003  Job:  [id number]

## 2011-04-28 NOTE — Cardiovascular Report (Signed)
   NAME:  Betzler, Jamicah L                              ACCOUNT NO.:  [id number]   MEDICAL RECORD NO.:  [id number]                   PATIENT TYPE:  OIB   LOCATION:  2875                                 FACILITY:  [facility]   PHYSICIAN:  Balinda Quails, M.D.                 DATE OF BIRTH:  1929/07/23   DATE OF PROCEDURE:  09/24/2003  DATE OF DISCHARGE:  09/24/2003                              CARDIAC CATHETERIZATION   DIAGNOSIS:  Abdominal aortic aneurysm and bilateral common iliac aneurysms.   PROCEDURE:  Abdominal aortogram with pelvic runoff arteriography.   SURGEON:  Balinda Quails, M.D.   ACCESS:  Right common femoral artery, 5 French sheath.  /CONTRAST/>  110 ml Visipaque.   COMPLICATIONS:  None apparent.    DICTATION ENDS AT THIS POINT                                               Balinda Quails, M.D.    PGH/MEDQ  D:  09/24/2003  T:  09/25/2003  Job:  [id number]   cc:   [facility]   Colleen Can. Deborah Chalk, M.D.  Fax: [phone]

## 2011-04-28 NOTE — Consult Note (Signed)
NAME:  Corey [name], Corey [name]                              ACCOUNT NO.:  [id number]   MEDICAL RECORD NO.:  [id number]                   PATIENT TYPE:  INP   LOCATION:  3306                                 FACILITY:  [facility]   PHYSICIAN:  Claudette Laws, M.D.               DATE OF BIRTH:  1929/06/21   DATE OF CONSULTATION:  11/12/2003  DATE OF DISCHARGE:                                   CONSULTATION   <REFERRING PHYSICIAN/>  P. Liliane Bade, M.D.   CHIEF COMPLAINT:  Urinary retention.   INDICATIONS FOR PROCEDURE:  This is a 77 year old man with a complicated  prolonged hospitalization, status post  repair of an abdominal aortic  aneurysm, followed  by open gastrostomy tube placement  by Dr. Violeta Gelinas on October 29, 2003. He developed urinary retention. He has a  history of BPH and may have been in retention in the past. Currently he has  a Foley catheter in. It was taken out yesterday. He could not void. It was  replaced with about a 500 to 600 mL residual. The rest of the history was  reviewed by me. Reviewing his laboratory data, he had a BUN of 21 and a  creatinine of 0.9 on November 06, 2003.   PHYSICAL EXAMINATION:  This is a cooperative gentleman, in no acute  distress. He has a tracheostomy. His abdomen  is benign with a well healed  lower midline scar. Gastrostomy tube in place. No obvious hernias.  Genitalia, circumcised male with an indwelling Foley catheter draining  grossly clear  urine. Scrotum, small left hydrocele which apparently he has  had for 25 years. Normal right testicle. Rectal examination, a broad, +2  benign smooth feeling gland, no obvious nodules, nontender, no other  anorectal lesions, good sphincter tone.   IMPRESSION:  Benign prostatic hypertrophy with postoperative urinary  retention.   DISCUSSION:  I went over the findings with the patient and I thought I would  start him on Flomax 0.4 mg a day, keep the catheter in for a few days and  then remove it for  another trial of voiding. Conceivably he may have to go  home with an indwelling Foley catheter if he does not void spontaneously  prior to discharge. According to the nurse, though, he is going to be here  for a while.                                               Claudette Laws, M.D.    RFS/MEDQ  D:  11/12/2003  T:  11/14/2003  Job:  [id number]

## 2011-04-28 NOTE — Op Note (Signed)
NAME:  Corey [name], Corey [name]                              ACCOUNT NO.:  [id number]   MEDICAL RECORD NO.:  [id number]                   PATIENT TYPE:  OUT   LOCATION:  ENDO                                 FACILITY:  [facility]   PHYSICIAN:  John C. Madilyn Fireman, M.D.                 DATE OF BIRTH:  [birth year]   DATE OF PROCEDURE:  04/14/2003  DATE OF DISCHARGE:  12/23/2002                                 OPERATIVE REPORT   PROCEDURE PERFORMED:  Esophagogastroduodenoscopy with esophageal dilatation.   ENDOSCOPIST:  Barrie Folk, M.D.   INDICATIONS FOR PROCEDURE:  Recent esophageal food impaction with history of  esophageal stricture requiring dilatation in the past.   DESCRIPTION OF PROCEDURE:  The patient was placed in the left lateral  decubitus position and placed on the pulse monitor with continuous low-flow  oxygen delivered by nasal cannula.  He was sedated with 62.5 mcg of IV  fentanyl and 5 mg IV Versed.  The Olympus video endoscope was advanced under  direct vision into the oropharynx and esophagus.  The esophagus was slightly  tortuous but of normal caliber with the squamocolumnar line at 38 cm.  I did  not clearly appreciate a stricture or ring.  However, there was a mild  esophageal diverticulum about 4 cm proximal to the gastroesophageal  junction.  In the area of the esophagus it appeared somewhat tortuous.  The  scope was advanced easily beyond this and into the stomach, where there was  a small amount of liquid secretions suctioned from the fundus.  Retroflex  view of the cardia was unremarkable.  The fundus, body, antrum and pylorus  all appeared normal.  The duodenum was entered and both the bulb and second  portion were well inspected and appeared to be within normal limits.  The  scope was advanced as far as possible down the distal duodenum and a  guidewire placed and the scope withdrawn.  Savary dilators of 16 and 17 mm  were passed consecutively under fluoroscopic visualization with  no blood  seen on either of the dilators and only mild to moderate resistance with the  17 mm dilator which was removed together with the wire.  The patient was  returned to the recovery room in stable condition.  The patient tolerated  the procedure well.  There were no immediate complications.   IMPRESSION:  Distal esophageal diverticulum, questionable distal esophageal  stricture, status post dilatation.   PLAN:  Will advance diet and observe response to dilatation.                                               John C. Madilyn Fireman, M.D.    JCH/MEDQ  D:  04/14/2003  T:  04/14/2003  Job:  [id number]   cc:   Larina Earthly, M.D.  [address]  [place]  [place] [address]  Fax: [phone]

## 2011-04-28 NOTE — Op Note (Signed)
NAME:  Corey [name], Corey [name]                              ACCOUNT NO.:  [id number]   MEDICAL RECORD NO.:  [id number]                   PATIENT TYPE:  INP   LOCATION:  2311                                 FACILITY:  [facility]   PHYSICIAN:  Gabrielle Dare. Janee Morn, M.D.             DATE OF BIRTH:  07/22/1929   DATE OF PROCEDURE:  10/29/2003  DATE OF DISCHARGE:                                 OPERATIVE REPORT   PREOPERATIVE DIAGNOSIS:  Need for enteral feeding access.   POSTOPERATIVE DIAGNOSIS:  Need for enteral feeding access.   PROCEDURE:  Open gastrostomy tube placement.   SURGEON:  Gabrielle Dare. Janee Morn, M.D.   ASSISTANT:  Leonie Man, M.D.   HISTORY OF PRESENT ILLNESS:  The patient is a 77 year old white male who has  had a prolonged hospitalization, status post  repair of an abdominal aortic  aneurysm. He has the need for long-term enteral access, as he has dysphagia  at this time. The GI service tried unsuccessfully to place a percutaneous  endoscopic gastrostomy tube placement, and he is brought now today for an  open gastrostomy tube placement.   DESCRIPTION OF PROCEDURE:  Informed consent was obtained from the patient's  wife. He received intravenous antibiotics and was brought to the operating  room. General anesthesia was administered. His abdomen  was prepped and  draped in a sterile fashion.   A left upper quadrant subcostal incision was made. The subcutaneous tissues  were dissected down. The fascia and abdominal musculature was divided and  the peritoneal cavity was entered under direct vision without difficulty.  There were 2 small omental adhesions in the area and  these were taken  down. The abdominal wall inside was otherwise  free.   The stomach was visualized and grasped and delivered into the wound. A  pursestring suture  was placed. Subsequently a gastrotomy was made in the  center of this pursestring. A #24 mushroom catheter was modified by cutting  some extra holes in the  tip portion, and this was inserted into the  gastrotomy without difficulty. The 1st pursestring suture  was tied, leaving  the needle  and suture  on for later securing it to the abdominal wall. A  2nd pursestring suture was placed in a large concentric circle around where  the tube entered the stomach. This was also cut.   Subsequently a small  incision was made inferomedially to our original  incision for exiting of the tube. A clamp was placed through this, exposing  the inside of the abdominal wall nicely. The  sutures previously used for  the pursestring were used to tack the stomach  up to the anterior abdominal  wall posteriorly. The tube was then brought out through the abdominal wall.  The stomach was approximated to the anterior abdominal wall and these 2  sutures were tied securely.   The anterior portion of the stomach  was then securely tacked around the exit  site with a series of interrupted 2-0 silk sutures. The tube was totally  covered with this secure tacking technique. Once that was accomplished, the  abdomen was copiously irrigated. The tube was checked and water by gravity  flowed nicely.   Meticulous hemostasis was assured and the abdomen  was closed with a running  #1 PDS suture. The subcutaneous tissues were irrigated out. The skin was  closed with staples. The tube was placed to gravity drainage and an  abdominal binder was placed loosely to prevent the patient from dislodging  the gastrostomy tube. All sponge, instrument and needle counts were correct.  The patient was taken to the recovery room in stable condition.                                               Gabrielle Dare Janee Morn, M.D.    BET/MEDQ  D:  10/29/2003  T:  10/30/2003  Job:  [id number]

## 2011-04-28 NOTE — H&P (Signed)
NAME:  Corey [name], Corey [name]                    ACCOUNT NO.:  [id number]   MEDICAL RECORD NO.:  [id number]            PATIENT TYPE:   LOCATION:                                 FACILITY:   PHYSICIAN:  Colleen Can. Deborah Chalk, M.D.    DATE OF BIRTH:   DATE OF ADMISSION:  09/19/2005  DATE OF DISCHARGE:                                HISTORY & PHYSICAL   CHIEF COMPLAINT:  None.   HISTORY OF PRESENT ILLNESS:  Corey [name] is a very pleasant 77 year old white  male.  He has had a longstanding history of known aortic insufficiency.  He  has had hypercholesterolemia as well as hypertension and has had previous  abdominal aortic aneurysm repair.  He has basically done well from a cardiac  standpoint without complaints of chest pain.  His wife has noted a mild  degree of fatigue over the last several months.  He was seen for his regular  office visit toward the later part of August and was referred for a  Cardiolite study.  An adenosine Cardiolite was performed on August 18, 2005.  This demonstrated normal LV systolic function.  There was apical  ischemia.  He is now referred for elective cardiac catheterization.   PAST MEDICAL HISTORY:  1.  Abdominal aortic aneurysm and iliac repair in October 2004, complicated      by marked confusion.  2.  Known aortic insufficiency.  His most recent 2D echocardiogram is from      September 2006 with an ejection fraction of 55-60%.  There was moderate      to severe aortic regurgitation, a systolic anterior motion of the mitral      valve, mildly dilated aortic root, mild LVH, mild pulmonary      hypertension, mild left atrial enlargement with impaired LV relaxation.  3.  Hypertension.  4.  Hyperlipidemia.  5.  Bilateral inguinal hernia surgery.  6.  Remote history of hydrocele repair.   ALLERGIES:  IV DYE.   CURRENT MEDICINES:  1.  Humibid p.r.n.  2.  Claritin p.r.n.  3.  Vitamin C daily.  4.  Baby aspirin daily.  5.  Hydrochlorothiazide 25 mg, a half a tablet  daily.  6.  Zocor 80 every day.  7.  Monopril 20 mg a day.   FAMILY HISTORY:  Father died at 28 with stomach cancer.  His mother died of  an aneurysm at the age of 75.   SOCIAL HISTORY:  He is married.  He has one son and one daughter.  He lives  at home with his wife.  He has no tobacco or excessive alcohol use.   REVIEW OF SYSTEMS:  Basically as noted above and is otherwise unremarkable.   PHYSICAL EXAMINATION:  GENERAL:  He is a very pleasant white male who  appears younger than his stated age.  VITAL SIGNS:  Blood pressure is 130/60 sitting, 130/70 standing.  Weight is  153 pounds.  Heart rate 72 and regular.  HEENT:  Unremarkable.  NECK:  Supple.  LUNGS:  Clear.  CARDIAC:  Shows a grade 2/6 diastolic murmur of AI.  ABDOMEN:  Soft.  Positive bowel sounds.  Nontender.  EXTREMITIES:  Without edema.  NEUROLOGIC:  Intact.   PERTINENT LABS:  Pending.   OVERALL IMPRESSION:  1.  Abnormal adenosine Cardiolite study.  2.  Known aortic regurgitation.  3.  Previous history of abdominal aortic aneurysm repair.  4.  Hypertension.  5.  Hyperlipidemia.   PLAN:  1.  We will proceed on with elective cardiac catheterization.  2.  The patient will be pre-medicated with prednisone 18 hours prior to the      procedure.  3.  The procedure as well as the risks and benefits have been explained to      both he and his wife, and he is willing to proceed on Tuesday, September 19, 2005.      Sharlee Blew, N.P.      Colleen Can. Deborah Chalk, M.D.  Electronically Signed    LC/MEDQ  D:  09/15/2005  T:  09/15/2005  Job:  [id number]   cc:   Larina Earthly, M.D.  Fax: [phone]   Colleen Can. Deborah Chalk, M.D.  Fax: [phone]

## 2011-04-28 NOTE — Cardiovascular Report (Signed)
NAME:  Corey [name], Corey [name]                    ACCOUNT NO.:  [id number]   MEDICAL RECORD NO.:  [id number]          PATIENT TYPE:  OIB   LOCATION:  2899                         FACILITY:  [facility]   PHYSICIAN:  Colleen Can. Deborah Chalk, M.D.DATE OF BIRTH:  July 15, 1929   DATE OF PROCEDURE:  09/19/2005  DATE OF DISCHARGE:                              CARDIAC CATHETERIZATION   PROCEDURE:  Left heart catheterization with selective coronary angiography,  left ventricular angiography, and aortic root angiography.   TYPE AND SITE OF ENTRY:  Percutaneous right femoral artery with AngioSeal.   CATHETERS:  6-French 5 curved Judkins left coronary catheter 6-French 4  curved Judkins right coronary catheter, 6-French pigtail ventriculographic  catheter.   CONTRAST:  Pure Omnipaque.   MEDICATIONS GIVEN PRIOR TO PROCEDURE:  Valium 10 mg., Benadryl, Pepcid.   MEDICATIONS GIVEN DURING THE PROCEDURE:  Versed 4 mg IV, Ancef 1 g IV   COMMENT:  The patient tolerated the procedure well.   HEMODYNAMIC DATA:  Aortic pressure was 127/73, LV 169/10-19. There was no  aortic valve gradient noted at time was pullback.   ANGIOGRAPHIC DATA:  1.  His left main coronary artery is short and it is almost a dual ostium.  2.  Left circumflex: His left circumflex has calcification proximally. It is      a dominant circumflex. There is bifurcating obtuse marginal. Minor      irregularities in the left circumflex system.  3.  Left anterior descending: Left anterior descending continues primarily      as a bifurcating system with a moderately large first diagonal. Just      after the first diagonal, there appears to be 50% narrowing at the      ostium of the LAD. The LAD extends to the apex and ends on the anterior      wall. There are minor irregularities otherwise. There is calcification      in the proximal LAD.  4.  Right coronary artery: The right coronary artery is a nondominant small      vessel. There are several branches on the  inferior wall that are small      nondominant in nature.   Left ventricular angiogram: Left ventricular angiogram demonstrates a  deformed aortic root. The aorta is dilated. Accurate measurements will be  obtained by CT and MRI. There appears to be 2+ to 3+ aortic insufficiency.   The left ventricular angiogram was performed in RAO position. Overall  cardiac size and silhouette are normal. Global ejection fraction was 65%.  Regional wall motion is normal.   OVERALL IMPRESSION:  1.  Normal left ventricular function.  2.  There is 2+ aortic insufficiency.  3.  Dilated aorta and descending aorta.  4.  Moderate coronary atherosclerosis with 20-30% irregularities in the left      circumflex, 40-50% narrowing in the proximal left anterior descending      just after the first diagonal vessel, minimal irregularities in the      right coronary artery.   DISCUSSION:  It would appear that there is no significant coronary  ischemia  noted. There remains a problem of the somewhat marked dilation of the entire  aorta including the ascending aorta and thoracic aorta. Will continue to  manage him medically and continue to observe for aneurysmal growth and/or  other issues.      Colleen Can. Deborah Chalk, M.D.  Electronically Signed     SNT/MEDQ  D:  09/19/2005  T:  09/19/2005  Job:  [id number]   cc:   Loraine Leriche A. Perini, M.D.  Fax: [phone]

## 2011-04-28 NOTE — Procedures (Signed)
[facility]  Patient:    DOMNICK, CHERVENAK Visit Number: [id number] MRN: [id number]          Service Type: Attending:  Everardo All. Madilyn Fireman, M.D. Proc. Date: 09/20/01   CC:         Ravi R. Felipa Eth, M.D.   Procedure Report  PROCEDURE:  Colonoscopy.  INDICATION FOR PROCEDURE:  Family history of colon cancer in a first-degree relative.  DESCRIPTION OF PROCEDURE:  The patient was placed in the left lateral decubitus position and placed on the pulse monitor with continuous low-flow oxygen delivered by nasal cannula.  He was sedated with 10 mg IV Demerol in addition to the 50 mg of Demerol and 5 mg IV Versed given for the previous EGD.  The Olympus video colonoscope was inserted into the rectum and advanced to the cecum, confirmed by transillumination at McBurneys point and visualization of the ileocecal valve and appendiceal orifice.  The prep was excellent.  The cecum, ascending, transverse and descending colon all appeared normal with no masses, polyps, diverticula, or other mucosal abnormalities. Within the sigmoid colon there were seen several scattered diverticula and no other abnormalities.  The rectum appeared normal, and retroflexed view of the anus revealed no obvious internal hemorrhoids.  The colonoscope was then withdrawn and the patient returned to the recovery room in stable condition. He tolerated the procedure well, and there were no immediate complications.  IMPRESSION: 1. Sigmoid diverticulosis. 2. Otherwise normal colonoscopy.  PLAN:  Repeat colonoscopy in five years. Attending:  Everardo All. Madilyn Fireman, M.D. DD:  09/20/01 TD:  09/21/01 Job: [id number]

## 2011-04-28 NOTE — Discharge Summary (Signed)
NAME:  Jaso, Hawkin L                              ACCOUNT NO.:  [id number]   MEDICAL RECORD NO.:  [id number]                   PATIENT TYPE:  INP   LOCATION:  3306                                 FACILITY:  [facility]   PHYSICIAN:  Balinda Quails, M.D.                 DATE OF BIRTH:  22-Jul-1929   DATE OF ADMISSION:  10/08/2003  DATE OF DISCHARGE:  11/20/2003                                 DISCHARGE SUMMARY   ADDENDUM TO DISCHARGE SUMMARY:  Mr. Nordby was discharged as planned on November 20, 2003.  Although, he did  not have his PEG tube removed on November 19, 2003 as stated in discharge  summary.  The general surgery team would like to see the PEG tube in place  until December 10, 2003.  They have planned outpatient follow up with Mr.  Mcvay to have this PEG tube removed in their clinic.      Carolyn A. Arlean Hopping, M.D.    CAF/MEDQ  D:  11/20/2003  T:  11/21/2003  Job:  [id number]

## 2011-04-28 NOTE — Discharge Summary (Signed)
NAME:  Corey [name], Corey [name]                    ACCOUNT NO.:  [id number]   MEDICAL RECORD NO.:  [id number]          PATIENT TYPE:  OBV   LOCATION:  4729                         FACILITY:  [facility]   PHYSICIAN:  Petra Kuba, M.D.    DATE OF BIRTH:  08/02/1929   DATE OF ADMISSION:  08/07/2007  DATE OF DISCHARGE:  08/08/2007                               DISCHARGE SUMMARY   HISTORY:  The patient was admitted by my partner, Dr. Bernette Redbird,  with a food impaction.  He was unable to clear it.  We kept him  overnight, and I was able to clear it the next day.   We also saw most of the fragments in an esophageal large diverticulum,  and it looked like he might have had achalasia, so we went ahead and  injected Botox into the GE junction.  He was able to eat, was feeling  much better, and was deemed worthy for discharge after that procedure.  There were no GI complications.   DISCHARGE INSTRUCTIONS:  Include:  1. Liquids only for 24 hours.  2. No aspirin or nonsteroidals for 3 days.  3. Prevacid SoluTabs, which he would pick up at our office in addition      to all of his regular medicines.  4. Not able to drive or use sharp objects for 24 hours.  5. Call office to make an appointment in 1 month.  Call sooner if      question or problems, particularly increased fever, pain, or      swallowing difficulties.   CONDITION ON DISCHARGE:  Improved, as mentioned above.   FINAL DISCHARGE DIAGNOSIS:  1. Food impaction secondary to possible achalasia and esophageal      diverticulum, status post removal.  2. Hypertension.  3. Mild chronic obstructive pulmonary disease.  4. Aortic insufficiency.  5. Abdominal aortic aneurysm repair.           ______________________________  Petra Kuba, M.D.     MEM/MEDQ  D:  09/06/2007  T:  09/07/2007  Job:  [id number]   cc:   Larina Earthly, M.D.

## 2011-04-28 NOTE — Op Note (Signed)
[facility]  Patient:    Corey [name], Corey [name] Visit Number: [id number] MRN: [id number]          Service Type: END Location: ENDO Attending Physician:  Louie Bun Proc. Date: 09/20/01 Admit Date:  09/20/2001   CC:         Ravi R. Felipa Eth, M.D.   Operative Report  PROCEDURE:  Esophagogastroduodenoscopy with biopsy and esophageal dilatation.  INDICATION FOR PROCEDURE:  Solid food dysphagia with esophageal stricture suggested by barium swallow.  In addition, he reportedly had a thickened appearance to the fundus on CT scan and reportedly has a family history of stomach cancer in a first degree relative.  DESCRIPTION OF PROCEDURE:  The patient was placed in the left lateral decubitus position and placed on the pulse monitor with continuous low-flow oxygen delivered by nasal cannula.  He was sedated with 50 mg IV Demerol and 5 mg IV Versed.  The Olympus video endoscope was advanced under direct vision into the oropharynx and esophagus.  The esophagus was straight and of normal caliber with the squamocolumnar line at 38 cm.  It did appear somewhat narrowed and tight, although there was no visible mucosal line, and the squamocolumnar line was sharply outlined at 38 cm.  There was minimal resistance to passage of the scope beyond the GE junction.  There was no visible hiatal hernia.  The stomach was entered, and a small amount of liquid secretions were suctioned from the fundus.  Retroflexed view of the cardia was unremarkable.  The fundus appeared normal on careful inspection as did the body.  The antrum, however, showed elevated areas with central punctate depressions and surrounding erythema consistent with a moderate antral gastritis.  A CLOtest was obtained.  The pylorus was nondeformed and easily allowed passage of the endoscope deep into the duodenum.  Both the bulb and second portion were well-inspected and appeared to be within normal limits.   A CLOtest was obtained.  The scope was advanced as far as possible down the duodenum, and both the bulb and second portion appeared to be within normal limits.  A Savary guidewire was placed through the endoscope channel and the scope withdrawn.  Savary dilators of 15 and 16 mm were passed consecutively over the guidewire to mild resistance with no blood seen on either dilator. The last dilator was removed together with the wire and the patient returned to the recovery room in stable condition.  He tolerated the procedure well, and there were no immediate complications.  IMPRESSION: 1. Stricture of the gastroesophageal junction. 2. No abnormality of the fundus. 3. Antral gastritis.  PLAN:  Advance diet and observe response to dilatation.  We will also await CLOtest and treat for eradication if Helicobacter positive. Attending Physician:  Louie Bun DD:  09/20/01 TD:  09/21/01 Job: [id number]

## 2011-04-28 NOTE — H&P (Signed)
NAME:  Corey [name], Corey [name]                              ACCOUNT NO.:  [id number]   MEDICAL RECORD NO.:  [id number]                   PATIENT TYPE:  OIB   LOCATION:  NA                                   FACILITY:  [facility]   PHYSICIAN:  Carolyn A. Thelma Barge, P.A.            DATE OF BIRTH:  [birth year]   DATE OF ADMISSION:  10/08/2003  DATE OF DISCHARGE:                                HISTORY & PHYSICAL   REFERRING PHYSICIAN:  Colleen Can. Deborah Chalk, M.D.   PRIMARY CARE PHYSICIAN:  Larina Earthly, M.D.   HISTORY OF PRESENT ILLNESS:  This is a 77 year old male with a history of an  enlarging abdominal aortic aneurysm.  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  The patient denies any abdominal or back symptoms at  this time.  He does have a strong family history of aneurysm disease.  His  mother died of a ruptured aneurysm at age 70, and his sister died of a  ruptured abdominal aneurysm at age 19.  The patient was seen by Dr. Madilyn Fireman,  at which time they discussed surgical options.  The risks, benefits,  complications, and alternatives were discussed with the patient and his wife  and he wished to proceed with surgery.   ALLERGIES:  The patient has no known drug allergies, although he does have a  sensitivity to DYE for which he is premedicated with Benadryl and steroids.   PRESENT MEDICATIONS:  1. Monopril 20 mg daily.  2. Hydrochlorothiazide 25 mg daily.  3. Zocor 80 mg daily.  4. Aspirin 325 mg daily.  5. Vitamin C 500 mg daily.  6. Calcium with vitamin D 1200/400 mg daily.  7. Claritin as needed.  8. Humibid as needed.   PAST MEDICAL HISTORY:  1. Hypertension.  2. Hyperlipidemia.  3. Left inguinal hernia repair about 30 years ago.  4. Right hydrocele about 25 years ago.  5. Mild aortic insufficiency for which the patient is premedicated for any     dental or other significant procedures.  6. Mild  esophageal stricture.   SOCIAL HISTORY:  The patient is retired and lives here in [place].  He is  married and has four children.  He does not currently smoke, but did in the  past, and quit about 20 years ago.  He denies any alcohol use.   FAMILY HISTORY:  The patient's brother died at age 57 of lung cancer, and  his father died at age 39 of colon cancer.  Otherwise, he denies any family  history of diabetes, heart disease, pulmonary disease.   REVIEW OF SYSTEMS:  Positive for the following:  Urinary frequency, easy  bruising and bleeding, remote history of pneumonia, mild dyspnea on  exertion, difficulty swallowing.  The patient does wear glasses.  Otherwise,  12 point  review of systems is negative.   PHYSICAL EXAMINATION:  VITAL SIGNS:  The patient's vital signs are as  follows:  Blood pressure is 120/70 in the left arm, pulse is 115,  respirations are 18, weight is 158 pounds, and height is 5 feet 8 inches.  GENERAL:  This is a well-developed, well-nourished 77 year old male in no  acute distress.  SKIN:  Very thin without turgor and is evidently easily bruised.  HEENT:  The patient wears glasses.  EOMI.  His conjunctivae are clear,  sclerae are anicteric, PERRLA.  His hearing is within normal limits without  hearing aids.  He has evidence of some dental work, but has all his native  teeth.  There are no oral sores or exudates.  NECK:  Supple, without lymphadenopathy or thyromegaly.  CHEST:  His lungs are clear to auscultation without wheezes, crackles, or  rales.  He has no spinal or CVA tenderness.  HEART:  The patient's rate is tachycardic, but is in a regular rhythm.  He  has no murmur, gallop, or rub, and a normal S1 and S2.  His peripheral  pulses are as follows:  2+ bilateral carotid, radial, femoral pulses, and 1+  bilateral posterior tibial pulses.  He has very minimal varicosities.  EXTREMITIES:  Warm and dry with some hair loss in the lower leg.  ABDOMEN:  Soft,  nontender, nondistended, with good bowel sounds.  He has no  organomegaly or masses.  He does have a pelvic pulsatile mass and no  abdominal bruit.  MUSCULOSKELETAL/NEUROLOGIC:  He has 5/5 strength throughout.  His cranial  nerves are grossly intact.  His sensation is intact.   IMPRESSION:  This is a 77 year old male with a significant family history of  aneurysm rupture and a personal history of an enlarging abdominal aortic  aneurysm and bilateral iliac aneurysms.   PLAN:  We will proceed with elective repair of the abdominal aneurysm as  well as the bilateral iliac aneurysms with a repair and grafting of  abdominal aortic aneurysm performed by Dr. Madilyn Fireman on October 08, 2003.                                                 Carolyn A. Eustaquio Boyden.    CAF/MEDQ  D:  10/06/2003  T:  10/06/2003  Job:  [id number]   cc:   Colleen Can. Deborah Chalk, M.D.  Fax: [phone]   Larina Earthly, M.D.  [address]  [place]  [place] [address]  Fax: [phone]

## 2011-04-28 NOTE — Op Note (Signed)
   NAME:  Corey [name], Corey [name]                              ACCOUNT NO.:  [id number]   MEDICAL RECORD NO.:  [id number]                   PATIENT TYPE:  OUT   LOCATION:  ENDO                                 FACILITY:  [facility]   PHYSICIAN:  James [name]. Malon Kindle., M.D.          DATE OF BIRTH:  [birth year]   DATE OF PROCEDURE:  12/23/2002  DATE OF DISCHARGE:                                 OPERATIVE REPORT   PROCEDURE:  Esophagogastroduodenoscopy and removal of meat impaction.   MEDICATIONS:  Fentanyl 50 mcg, Versed 4 mg IV, and Glucagon 1 mg IV.   INDICATIONS:  The patient was eating a hotdog at lunch and has been unable  to swallow since then.  Apparently, he has had a history of a narrowing in  the past, seen by Dr. Madilyn Fireman.   DESCRIPTION OF PROCEDURE:  The procedure, including the potential risks and  benefits and the possibility of problems if an impaction was left in place  were explained to the patient and consent obtained.  With the patient in the  left lateral decubitus position, he had some minimal sedation, and the  Olympus scope was inserted and advanced under direct visualization.  When  the distal esophagus was reached, the patient did have a meat impaction.  I  tried to break it up.  It was quite mushy, and we were not able to  successfully break it up with the top of the ___________, so we used a  basket and were able to get the basket around this and were able to remove  it.  The scope was reinserted and advanced back to the area.  There was a  slight stricture there, a 2-3 cm hiatal hernia.  The stricture was  ______________. The scope was withdrawn.  The proximal esophagus was normal.  The patient was monitored throughout and given oxygen by nasal cannula.  There were no immediate complications.   ASSESSMENT:  1. Food impaction, removed.  2. Mild esophageal stricture.    PLAN:  Will have the patient remain on clear liquids in the morning and then  slowly advance his diet.  I  will have him follow up with Dr. Madilyn Fireman in 6-8  weeks.                                               James [name]. Malon Kindle., M.D.    Waldron Session  D:  12/23/2002  T:  12/24/2002  Job:  [id number]   cc:   Larina Earthly, M.D.  [address]  [place]  [place] [address]  Fax: [phone]   Everardo All. Madilyn Fireman, M.D.  [address], [address]  [place]  [place] [address]  Fax: [phone]

## 2011-04-28 NOTE — Cardiovascular Report (Signed)
NAME:  Corey [name], Corey [name]                              ACCOUNT NO.:  [id number]   MEDICAL RECORD NO.:  [id number]                   PATIENT TYPE:  OIB   LOCATION:  2875                                 FACILITY:  [facility]   PHYSICIAN:  Balinda Quails, M.D.                 DATE OF BIRTH:  March 26, 1929   DATE OF PROCEDURE:  09/24/2003  DATE OF DISCHARGE:  09/24/2003                              CARDIAC CATHETERIZATION   DIAGNOSIS:  1. Infrarenal abdominal aortic aneurysm.  2. Bilateral common iliac artery aneurysms.   SURGEON:  Balinda Quails, M.D.   ACCESS:  Right common femoral artery, 5 French sheath.   CONTRAST:  110 ml of Visipaque.   COMPLICATIONS:  None apparent.   INDICATIONS:  [name] is a 77 year old male with known abdominal aortic  aneurysm and bilateral common iliac aneurysms.  His right common iliac  aneurysm is quite large.  This will require operative repair.  He is not a  candidate for an aortic stent graft.  He is brought to the catheterization  lab at this time for diagnostic arteriography for planning of operative  management.   DESCRIPTION OF PROCEDURE:  The patient was brought to the catheterization  lab in stable condition, placed in the supine position and administered 2 mg  of Nubain and 2 mg of Versed intravenously.  The skin and subcutaneous  tissue in the right groin were instilled with 1% Xylocaine.  A needle was  easily introduced into the right common femoral artery.  A 0.35 J-wire was  passed through the needle and advanced into the right common iliac artery.  The J-wire could not be passed into the abdominal aorta.  The needle was  removed.  The 5 French sheath was advanced over the guidewire.  A short  right coronary catheter was then advanced over the guidewire.  The guidewire  was exchanged for an angled Glidewire.  With manipulation, the Glidewire was  then advanced into the abdominal aorta.  Pigtail catheter advanced up into  the abdominal aorta.   Standard AP and mid abdominal aortogram obtained.  This revealed single  widely patent bilateral renal arteries.  Brisk flow was noted through the  superior mesenteric artery.  There was tortuosity of the infrarenal aorta  with aneurysmal change.  There was a large aneurysm in the right common  iliac artery and a moderate size left common iliac artery aneurysm.   The lateral aortogram revealed a widely patent SMA and celiac arteries.   The pigtail catheter was then brought down into the aneurysm sac.  AP and  bilateral oblique pelvic arteriograms were obtained.  There was a large  right common iliac artery aneurysm and moderate size left common iliac  aneurysm.  The internal and external iliac arteries were widely patent  bilaterally with ectatic change.   This completed the arteriogram procedure.  Guidewire reinserted  and the  pigtail catheter removed.  The right femoral sheath was removed.  There are  no apparent complications.    FINAL IMPRESSION:  1. Infrarenal abdominal aortic aneurysm.  2. Bilateral common iliac artery aneurysm.  3. Single widely patent bilateral renal arteries.   DISPOSITION:  These results have been reviewed with the patient and the  family.  It was recommended that the patient undergo operative repair of  this intraabdominal aneurysm disease.  He is agreeable to this.  This will  be scheduled accordingly.                                               Balinda Quails, M.D.    PGH/MEDQ  D:  09/24/2003  T:  09/25/2003  Job:  [id number]   cc:   Colleen Can. Deborah Chalk, M.D.  Fax: [phone]

## 2011-04-28 NOTE — Op Note (Signed)
NAME:  Corey [name], Corey [name]                              ACCOUNT NO.:  [id number]   MEDICAL RECORD NO.:  [id number]                   PATIENT TYPE:  INP   LOCATION:  2311                                 FACILITY:  [facility]   PHYSICIAN:  Danise Edge, M.D.                DATE OF BIRTH:  [birth year]   DATE OF PROCEDURE:  10/29/2003  DATE OF DISCHARGE:                                 OPERATIVE REPORT   PROCEDURE:  Esophagogastroduodenoscopy.   INDICATIONS FOR PROCEDURE:  Corey [name] is a 77 year old male born 1929-10-02.  Corey [name] has respiratory failure and is ventilator dependent.  He  has undergone a recent surgical repair of his abdominal aortic aneurysm and  bilateral iliac artery aneurysms.  When he attempts oral nutrition, he has  tracheal penetration of feeding.  He has pulled out a number of nasal  enteral feeding tubes.  PEG tube placement endoscopically is requested.   MEDICATION ALLERGIES:  Contrast dye.   CURRENT MEDICATIONS:  Albuterol, Dulcolax, Lovenox, Pepcid, iron, Atrovent,  Lisinopril, Seroquel.   PAST MEDICAL HISTORY:  Surgical repair abdominal aortic aneurysm and  bilateral iliac artery aneurysm, respiratory failure and ventilator  dependent, tracheostomy, hypertension, hyperlipidemia, left inguinal hernia  repair, mild aortic insufficiency, ischemic left leg leading to left  aortofemoral bypass.  October 2002 colonoscopy normal except for sigmoid  diverticulosis.  Multiple esophagogastroduodenoscopies to perform distal  esophageal stricture dilation.   HABITS:  Corey [name] quit smoking cigarettes 20 years ago and does not consume  alcohol.   FAMILY HISTORY:  Positive for lung cancer and colon cancer in his father.   ENDOSCOPIST:  Danise Edge, M.D.   PREMEDICATION:  Versed 5 mg, Demerol 30 mg.   PROCEDURE:  After obtaining confirmed consent, Corey [name] was placed in the  supine position.  Intravenous  Demerol and intravenous  Versed were used to  achieve  conscious sedation for the procedure.  The patient's blood pressure,  oxygen saturation, and cardiac rhythm were monitored.   The Olympus gastroscope was passed through the posterior hypopharynx into  the proximal esophagus without difficulty.  The hypopharynx and larynx  appeared normal.  I did not visualize the vocal cords.   ESOPHAGOSCOPY:  The proximal, mid and lower segments of the esophageal  mucosa appear normal.  The squamocolumnar junction (esophagogastric  junction) is noted at 42 cm from the incisor teeth.  There is a large  esophageal diverticulum noted at 35 cm from the incisor teeth.   GASTROSCOPY:  Retroflex view of the gastric cardia and fundus was normal.  The gastric body, antrum, and pylorus appeared normal.   DUODENOSCOPY:  The duodenal bulb and descending duodenum appeared normal.   ATTEMPTED PEG TUBE PLACEMENT:  I was unable to transilluminate the abdomen  with the endoscope in the gastric body.  As a result, it is not safe to  place a  percutaneous endoscopic gastrostomy tube due to failure perform  transillumination of the abdominal wall.   RECOMMENDATIONS:  Radiology will be consulted to place a gastrostomy tube.                                               Danise Edge, M.D.    MJ/MEDQ  D:  10/29/2003  T:  10/29/2003  Job:  [id number]

## 2011-04-28 NOTE — Discharge Summary (Signed)
NAME:  Corey [name], Corey [name]                              ACCOUNT NO.:  [id number]   MEDICAL RECORD NO.:  [id number]                   PATIENT TYPE:  INP   LOCATION:  3306                                 FACILITY:  [facility]   PHYSICIAN:  Larina Earthly, M.D.                 DATE OF BIRTH:  1929-01-15   DATE OF ADMISSION:  10/08/2003  DATE OF DISCHARGE:  11/20/2003                                 DISCHARGE SUMMARY   DISCHARGE DIAGNOSIS:  Status post resection and grafting of abdominal aortic  and bilateral iliac artery aneurysms with postoperative  complications  including respiratory failure requiring tracheostomy placement.  Additional  postoperative complications including postoperative encephalopathy,  postoperative swallowing dysfunction.   PROJECTED DURATION OF LOSS:  The patient's complex prolonged course has lead  to difficulty in achieving ability to perform activities of daily living on  an independent basis, specifically bathing, dressing, eating, toiletting.  Additionally, there is slow and steady improvement in regards to cognitive  impairment.  This began at the beginning of hospitalization at the time of  his operative procedure, October 08, 2003.  The projected time regarding  full rehabilitation is approximately in the three to six-month range.  The  patient will have arrangements and requirements regarding prolonged  occupational therapy, speech therapy, physical therapy, and will require the  use of home health nursing for the immediate future.      Rowe Clack, P.A.-C.                    Larina Earthly, M.D.    Sherryll Burger  D:  11/19/2003  T:  11/19/2003  Job:  [id number]

## 2011-04-28 NOTE — Consult Note (Signed)
NAME:  Corey [name]                              ACCOUNT NO.:  [id number]   MEDICAL RECORD NO.:  [id number]                   PATIENT TYPE:  INP   LOCATION:                                       FACILITY:  [facility]   PHYSICIAN:  Corey Dare. Janee [name], M.D.             DATE OF BIRTH:  21-Jul-1929   DATE OF CONSULTATION:  10/29/2003  DATE OF DISCHARGE:                                   CONSULTATION   REASON FOR CONSULTATION:  Need for open gastrostomy tube placement.   HISTORY OF PRESENT ILLNESS:  I was asked to evaluate this 77 year old white  male by Dr. Balinda [name] in regards to placement of an open gastrostomy  tube for feeding.  The patient underwent resection of an abdominal aortic  aneurysm by Dr. Madilyn [name] on October 08, 2003.  He has had a prolonged  postoperative course with ventilatory dependence.  He underwent tracheostomy  and has since been weaned successfully from the ventilator.  He has pulled  out several nasogastric feeding tubes.  Gastroenterology attempted today to  place a PEG tube, but it was not possible for them to do that.  The patient  is unable to swallow effectively and does have some aspiration.   PAST MEDICAL HISTORY:  1. Hypertension.  2. Hyperlipidemia.  3. Abdominal aortic aneurysm.  4. Aortic insufficiency.  5. Esophageal diverticulum.   PAST SURGICAL HISTORY:  1. Left inguinal hernia repair.  2. Right hydrocele repair.  3. Abdominal aortic aneurysm repair as described.  4. Tracheostomy.   SOCIAL HISTORY:  The patient is retired and quit smoking 20 years ago.   FAMILY HISTORY:  Father passed away with colon cancer.  He had a brother die  of lung cancer.   REVIEW OF SYSTEMS:  Unable to obtain secondary to the patient's unable to  give a good history.   ALLERGIES:  No known drug allergies.   PHYSICAL EXAMINATION:  VITAL SIGNS:  Heart rate 84, respirations 14, blood  pressure 100/47.  O2 saturations 100%.  GENERAL:  The patient is responsive.  NECK:  A  tracheostomy in place.  HEENT:  Pupils are equal and reactive.  Sclerae is nonicteric.  LUNGS:  Clear to auscultation bilaterally.  HEART:  Regular rate and rhythm.  PMI is palpable in the left chest.  ABDOMEN:  Soft.  He has a healed midline incision.  Bowel sounds are  present.  No tenderness is appreciated.  SKIN:  Warm and dry.  EXTREMITIES:  No edema.   IMPRESSION/PLAN:  A 77 year old male, status post abdominal aortic aneurysm  repair with need for enteral feeding access.  We will plan for open  gastrostomy tube placement.  This procedure was discussed in detail with the  patient's wife, including risks and benefits.  Her questions were answered.  We will proceed with surgery later today as the schedule permits.  Corey [name], M.D.    BET/MEDQ  D:  10/29/2003  T:  10/30/2003  Job:  [id number]

## 2011-04-28 NOTE — Discharge Summary (Signed)
NAME:  Corey [name], Corey [name]                              ACCOUNT NO.:  [id number]   MEDICAL RECORD NO.:  [id number]                   PATIENT TYPE:  INP   LOCATION:  3306                                 FACILITY:  [facility]   PHYSICIAN:  Balinda Quails, M.D.                 DATE OF BIRTH:  1929-05-29   DATE OF ADMISSION:  10/08/2003  DATE OF DISCHARGE:  11/20/2003                                 DISCHARGE SUMMARY   HISTORY OF PRESENT ILLNESS:  Corey [name] is a 77 year old male with a history  of an enlarging abdominal aortic aneurysm.  An abdominal ultrasound from  August, 2004 revealed the 4.2 cm abdominal aortic aneurysm and the 3.5 cm  right common iliac artery aneurysm.  The patient denied any abdominal or  back symptoms at that time.  He did have a strong family history of  aneurysmal disease.  His mother died of a ruptured aneurysm at age 85, and  his sister died of a ruptured abdominal aneurysm at age 37.   The patient was seen by Dr. Madilyn Fireman of [facility], at which time they discussed surgical options.  The risks,  benefits and complications and alternatives were discussed with the patient  at that time, and he wished to proceed with elective surgical repair of his  abdominal aortic aneurysm.   ALLERGIES:  No known allergies, although he does have a sensitivity to DYE  for which he is premedicated with Benadryl and steroids.   ADMISSION MEDICATIONS:  1. Monopril 20 mg daily.  2. Hydrochlorothiazide 25 mg daily.  3. Zocor 80 mg daily.  4. Aspirin 325 mg daily.  5. Vitamin C 500 mg daily.  6. Calcium with vitamin D 1200/400 mg daily.  7. Claritin as needed.  8. Humibid as needed.   PAST MEDICAL HISTORY:  1. Hypertension.  2. Hyperlipidemia.  3. Left inguinal hernia repair about 30 years ago.  4. Right hydrocele about 25 years ago.  5. Mild aortic insufficiency.  6. Mild esophageal stricture requiring several dilatations.   FAMILY HISTORY/SOCIAL  HISTORY/REVIEW OF SYSTEMS:  Please see the history and  physical done at the time of admission.   PHYSICAL EXAMINATION:  Please see the history and physical done at the time  of admission.   PROCEDURES:  1. Repair of infrarenal abdominal aortic aneurysm and bilateral common iliac     aneurysms with aortoiliac graft.  Bypass graft to right hypogastric     artery completed on October 08, 2003.  2. Left aortofemoral bypass with an 8 mm Hemashield graft for ischemic left     leg secondary to left external iliac dissection after the above-mentioned     repair completed on October 08, 2003.  3. Transfusion of blood products.  4. Reintubation by x2.  5. Intravenous antibiotics for gram-positive and gram-negative organisms in  the patient's sputum.  6. Tracheostomy for ventilator-dependent respiratory failure.  7. Head CT.  8. Multiple speech and swallow evaluations.  9. Lower extremity Doppler to rule out deep venous thrombosis.  10.      A two-dimensional echocardiogram.  11.      CT of the chest to rule out pulmonary embolus.  12.      Esophagogastroduodenoscopy and attempted bedside percutaneous     endoscopic gastrostomy tube placement by the Gastroenterology Service.     This was unsuccessful.  An open gastrostomy tube placement was performed     by General Surgery on October 29, 2003.  13.      A postvoid residual trial for urinary retention.   CONSULTATIONS:  1. Diabetes management.  2. Nutrition.  3. Speech therapy.  4. Physical therapy.  5. Case management.  6. Cardiology.  7. Gastroenterology.  8. Surgery.  9. Psychiatry.  10.      Urology.   HOSPITAL COURSE:  Corey [name] was admitted to [facility] and underwent  AAA repair with aortoiliac graft on October 08, 2003.  The patient tolerated  the procedure well and was transferred to the Postanesthesia Care Unit in  stable condition.  Later on that evening, the patient was found to have a  cool, pulseless  ischemic left leg.  He was taken back emergently to the  operating room, at which time he was found to have an external iliac  dissection.  Patient underwent a left aortofemoral bypass with an 8 mm  Hemashield graft to repair the dissection.  Again, the patient tolerated  this procedure well and was transferred to the Surgical Intensive Care Unit  in critical and guarded but stable condition.   The patient remained stable overnight and was extubated in the early a.m. on  postoperative day #1.  Unfortunately, the patient continued to be  significantly agitated and confused and was found unresponsive after IV  morphine.  The patient was urgently reintubated for desaturations,  unresponsive, and airway protection.   Over the next several days, the patient was slow to wean from the  ventilator.  Every time the patient was near protocol for extubation, his  anxiety increased as well as agitation and confusion.  There was a question  of whether the patient had postoperative encephalopathy; therefore, a CT  scan of the head was performed.  This showed no acute intracranial process.   A Panda tube was then placed for nutritional support, and tube feeds were  initiated secondary to the slow weaning process from ventilatory support and  no p.o. intake.   The patient remained intubated and sedated over the next several days.  He  had elevated temperatures and an elevated white count, so sputum cultures  were sent.  These were positive for gram-positive and gram-negative  organisms, and the patient was started on IV antibiotics.  The patient was  extubated once again on November 3 or postoperative day #5.  Unfortunately,  the patient was again reintubated later that day for increasing agitation,  stridorous respirations, and respiratory distress.   It was decided at that time that Corey [name] would benefit from a tracheostomy placement.  The patient was prepared for this procedure, which was  planned  for November 4.   The patient was taken to the operating room on October 15, 2003 and  underwent a tracheostomy placement for respiratory distress and failure to  wean from ventilatory support.  Over the next several days, the  patient  continued to be ventilator-dependent and with tracheotomy mask.  He  continued to have intermittent agitation and confusions.  The patient had a  sitter throughout this time.   Finally, on November 8, the patient was off the ventilator and was  tolerating a tracheotomy collar.  He was oxygenating well at about 94%.  A  swallowing evaluation was completed at that time secondary to the patient's  prolonged intubation.  This was positive for significant dysphagia, and the  recommendations were for the patient to remain n.p.o. and to repeat his  swallow study in about 3-5 days.  Tube feeds were continued at that time  secondary to patient's high risk of aspiration.  Patient continued to  require intermittent ventilatory support on rest mode, mostly for agitation.   On November 9, the patient was found to be in respiratory distress and had  an apparent obstruction of his tracheostomy.  The patient was bagged with  rapid response.  A bilateral lower extremity Doppler was completed at that  time to rule out DVT.  This showed no evidence of DVT, superficial  thrombosis, or baker's cyst.  The patient continued to improve in his  oxygenation but remained on ventilatory support.   On the following day, an EKG was completed as well as a 2-D echocardiogram  as well as cardiac enzymes to rule out myocardial infarction or injury.  These studies were all negative for myocardial ischemia or infarction.  A CT  of the chest was completed, also to rule out pulmonary embolus.  This was  negative for pulmonary embolus and showed bilateral air space disease with  evidence of COPD as well as bilateral effusions and bilateral lower lobe  atelectasis.  The CT of the  chest also revealed a 4 x 7 mm nodule in the  right middle lobe.  A CT of the head was also completed and only showed mild  brain atrophy and no acute intracranial process.   Finally, on November 12, the patient was off of ventilatory support.  Repeat  swallow evaluation was completed.  The recommendations were for dysphagia I  diet with honey-thick liquids with full supervision and strict aspiration  precautions.   On the following day, the patient continued to have ventilatory support  overnight and his tracheotomy collar during the day.  He continued a slow  and steady progress with dysphagia diet in addition to tube feedings.   The patient's tracheotomy was changed to a metal tracheotomy on November 16.  The patient tolerated this well and remained off of ventilatory support.   On November 17, a Gastroenterology consultation was initiated for placement of a PEG tube for poor p.o. intake and repeated pulling out of his Panda  tube by the patient.  The Gastroenterology service attempted a bedside PEG  tube placement on October 29, 2003 but were unable to complete this  secondary to inability to transilluminate the abdominal wall.  The placement  was unsuccessful, and a General Surgery consultation was initiated.  TNA was  started in anticipated of planned OR the following day for an open  gastrostomy tube placement.   On November 19, the patient was taken to the operating room and underwent  gastrostomy tube placement by General Surgery.  The patient tolerated the  procedure well and was transferred back to the Intermediate Care Unit in  stable condition.  Tube feedings were started, and meds were initiated via  the G-tube later that evening.  Patient was taken  off of TNA on October 31, 2003.  Over the next couple of days, the patient's tube feeds were slowly  increased to goal.  Although he continued to have intermittent agitation and  confusion, overall the patient's clinical  status remains stable.  We  continued supportive care with rehabilitation efforts and repeated swallow  evaluations.  Patient made very slow progress, mostly secondary to his  mental status changes.   Ultimately, a Psychiatry consultation was initiated.  Their impression was  that the patient had a situational delirium with phase-of-life problems.  They recommended to increase his dosing of Seroquel, which was followed  appropriately.   Over the next several days, the patient continued to improve, and his mental  status after the prescription of Seroquel was changed.  He was tolerating  his tube feeds with low residuals.  Patient continued with physical therapy  for reconditioning and was progressing fairly well.  His tracheotomy was  ultimately discontinued, and he was oxygenating well.  His Foley catheter  was discontinued on November 30.   His tracheostomy was ultimately discontinued also on December 1.  The  patient was oxygenating well and tolerated this without any complications.  An Urology consultation was initiated for urinary retention.  They initiated  Flomax therapy for this, and suggested outpatient followup.  A postvoid  residual trial was completed, and the patient did fairly well with about 500  mL of urine output after the Foley was discontinued.   Patient had multiple repeat swallow evaluations with the final  recommendation for a dysphagia III mechanical soft diet with honey-thickened  liquids.  They also recommended full supervision and strict aspiration  precautions as well as reflux precautions.  The recommendations were for all  of his meds to be crushed and then pureed consistency.  These  recommendations were all followed through, and the patient tolerated them  well.   On December 6, the patient continued to have improved mental status.  He  remained intermittently confused and agitated, although markedly improved from prior days.  He continued to tolerate  p.o. intake and tube feeds were  initiated as cycle therapy only through the night.  Ensure was added for  additional calories and nutritional supplementation.  Overall, the patient's  clinical status was remarkably improved.  Discharge planning was initiated.   Case management spent a significant amount of time planning with the patient  and his wife and the patient's daughters regarding discharge to home.  It  was planned for the patient to have home physical therapy, home health  nursing, as well as followup speech evaluation.  The patient's last voiding  trial was successful with 500 mL of urine without any residual.  On the  patient's last hospital day, his PEG tube was discontinued without  difficulty.   On the day prior to anticipated discharge, the patient's vital signs were as  follows:  Blood pressure was 90/50, temperature 97.6, respirations were 20,  pulse 92, SpO2 was 94% on room air.  The patient's weight was stable.  His  lungs were clear to auscultation.  His heart was in a regular rate and  rhythm and showed normal sinus rhythm on telemetry.  His abdomen was  nontender, nondistended with good bowel sounds.  Patient's extremities were  without edema, warm and dry.   DISCHARGE LABORATORY DATA:  Hemoglobin 11.5, hematocrit 34.1, WBC 8.0,  platelet 210.  Sodium 143, potassium 3.3, chloride 107, CO2 31, BUN 39,  creatinine 1.0, calcium 8.4,  glucose 123.  The patient's bedside glucose  measurements were stable in the 100-120s.   Overall, the patient was felt to be tentatively stable for discharge on the  morning of November 20, 2003 pending morning round evaluations.   DISCHARGE MEDICATIONS:  1. Zestril 20 mg p.o. daily.  2. Seroquel 50 mg p.o. b.i.d. at 8 a.m. and 2 p.m. and 200 mg daily at 6     p.m.  3. Flomax 0.4 mg p.o. daily.  4. Zocor 80 mg p.o. daily.  5. Hydrochlorothiazide 25 mg p.o. daily.  6. Ferrous sulfate 325 mg b.i.d.  7. Ranitidine 150 mg p.o. b.i.d.   8. Lasix 40 mg p.o. daily.  9. K-Dur (potassium) 20 mEq p.o. daily.  10.      Tylenol over-the-counter as needed for pain.   ACTIVITY:  The patient is to increase activity as tolerated and instructed  by home physical therapy and occupational therapy.   DIET:  Patient is to have full supervision and to follow swallow strategies  as instructed by the speech therapy team.  He is to follow a dysphagia III  mechanical soft, honey-thickened liquid diet.   WOUND CARE:  The patient may shower.  He is to notify the CVTS office if he  has any increased erythema, drainage, or dehiscence of his wound.   SPECIAL INSTRUCTIONS:  Patient will see physical therapy, occupational  therapy, and speech therapy through home health.  This has all been set up  by the case management team, and they will follow closely with the patient  and his family regarding these instructions.   FOLLOWUP:  Patient is to see Dr. Madilyn Fireman in two weeks with ABIs at the CVTS office.  Our office will call with appointment date and time.      Delilah Shan, P.A.-C               Balinda Quails, M.D.    CAF/MEDQ  D:  11/19/2003  T:  11/21/2003  Job:  [id number]   cc:   Claudette Laws, M.D.  [address]  [place]  [place] [address]  Fax: [phone]   Gabrielle Dare. Janee Morn, M.D.  [facility]  [address] [place], [place] [address]  Fax: [phone]   Colleen Can. Deborah Chalk, M.D.  Fax: [phone]   Larina Earthly, M.D.  [address]  [place]  [place] [address]  Fax: [phone]

## 2011-04-28 NOTE — Op Note (Signed)
NAME:  Corey [name], Corey [name]                              ACCOUNT NO.:  [id number]   MEDICAL RECORD NO.:  [id number]                   PATIENT TYPE:  INP   LOCATION:  2311                                 FACILITY:  [facility]   PHYSICIAN:  Balinda Quails, M.D.                 DATE OF BIRTH:  Aug 21, 1929   DATE OF PROCEDURE:  10/08/2003  DATE OF DISCHARGE:                                 OPERATIVE REPORT   SURGEON:  Balinda Quails, M.D.   ASSISTANTS:  Quita Skye. Hart Rochester, M.D. and Pecola Leisure, P.A.   ANESTHESIA:  General endotracheal anesthesia, anesthesiologist Quita Skye.  Krista Blue, M.D.   PREOPERATIVE DIAGNOSIS:  Infrarenal abdominal aortic aneurysm and bilateral  common iliac aneurysms.   POSTOPERATIVE DIAGNOSIS:  Infrarenal abdominal aortic aneurysm and bilateral  common iliac aneurysms.   PROCEDURE:  1. Repair of infrarenal abdominal aortic aneurysm and bilateral common iliac     aneurysms with aortoiliac graft.  2. Jump bypass graft to right hypogastric artery.   INDICATIONS FOR PROCEDURE:  Corey [name] is a 77 year old male who was found  to have fairly extensive  iliac aneurysm  disease bilaterally. A CT scan  revealed right common iliac artery aneurysm to be 3.7 cm in size. He  underwent  arteriogram and has a small abdominal aortic aneurysm  and  bilateral common iliac aneurysms. He  is brought to the operating room at  this time for repair of his aortic and iliac aneurysm disease. The risks and  benefits of this operative procedure were explained to the patient in  detail. Injury, morbidity and mortality associated with this operation were  3% to 5% to include but not limited to MI, CVA, renal failure, limb loss,  transfusion risk, infection, bleeding and death.   DESCRIPTION OF PROCEDURE:  The patient was brought to the operating room in  stable hemodynamic condition. A Foley catheter, arterial line, Swann-Ganz  catheter were in place. General endotracheal anesthesia was induced in  the  supine position. The abdomen and both legs were prepped and draped in a  sterile fashion.   A longitudinal skin incision was made through the abdomen from xiphoid to  pubis. The incision was extended deeply through the subcutaneous tissue with  electrocautery. The linea alba was incised. The peritoneal cavity was  entered without difficulty.   A full laparotomy evaluation  was carried out. Clear hepatic cysts were  noted in the liver. The gallbladder was normal. The bile ducts  were  unremarkable. The pancreas was normal. The stomach and small bowel were  normal. The large bowel revealed stool  but no masses.   The retroperitoneum revealed a small  infrarenal abdominal aortic aneurysm  approximately 3.5 to 4 cm in size. There was a large right common right  iliac aneurysm  which appeared  to be approximately 4 cm in size and a  similarly sized left common iliac aneurysm 3 to 4 cm in size.   The small bowel was retracted  to the right. The transverse colon was  brought superiorly. The retroperitoneum was incised along the duodenum. The  dissection was carried proximally. The inferior mesenteric vein was  mobilized and retracted superiorly. The left renal vein was skeletonized.  The juxtarenal aorta was cleared. The origin of the right renal artery was  clearly identified.   The neck of  the aneurysm was dissected free and  adequately for clamp  placement. Distal  dissection was then carried down the aneurysm  sac. The  origin of the inferior mesenteric artery was dissected out and encircled  with a fine vessel loop. The retroperitoneum was incised along the course of  the right common iliac artery. There was a large aneurysm  in the right  common iliac artery. The right ureter was reflected inferiorly.   The origin of the right internal and external iliac arteries was dissected  out. The iliac bifurcation was heavily calcified. The right external iliac  artery was noted to be  ectatic and fairly soft. The right internal iliac  artery revealed moderate calcification.   Attention was then placed on the left iliac system. The sigmoid mesocolon  was mobilized along the peritoneal reflection. The retroperitoneum was  entered lateral to the mesocolon. The left common iliac bifurcation was  exposed. The left ureter was reflected medially. The left common iliac  artery was aneurysmal, extending down to and involving the iliac  bifurcation. The left internal iliac artery was heavily calcified, it was  encircled with an umbilical tape  and ligated. The left external iliac  artery was mobilized. It was calcified proximally but softer more distally  in the pelvis and encircled with a vessel loop.   The patient was then administered 5000 units of heparin intravenously. A 2nd  dose of 2000 units of heparin was administered intravenously subsequently.  The infrarenal aorta was controlled with an aortic DeBakey clamp. The right  iliac vessels were controlled with clamps and the left external iliac artery  was controlled with a clamp. The left internal iliac artery was ligated with  an umbilical tape.   The aneurysm sac was then opened longitudinally with electrocautery.  Backbleeding in the lumbar vessels was controlled with figure of eight 2-0  silk suture. The aneurysm was opened up to the neck  and the aorta was  divided.   A 16 x 8 Hemashield graft  was chosen and this was anastomosed end-to-end to  the infrarenal aorta using a running 3-0 Prolene suture. At completion of  the proximal anastomosis the graft  was flushed and each limb of the graft  was controlled with a Fogarty clamp. The proximal clamp was released. The  proximal  anastomosis was hemostatic.   The right limb of the graft  was then brought down to the right common iliac  bifurcation. The right iliac aneurysm  was opened down to the origin of the internal and external iliac arteries. This was heavily  calcified. The right  internal and external iliac arteries were then divided at their origin. The  right limb of the graft was anastomosed end-to-end to the right external  iliac artery using running 4-0 Prolene suture.   A short segment of  8-mm Dacron graft was then anastomosed end-to-end to the  right hypogastric artery. This was then brought up to the right limb of the  graft. The right limb of  the aortoiliac graft  was opened obliquely and the  hypogastric jump graft was anastomosed end-to-side to the right aortoiliac  limb using running 5-0 Prolene suture. At completion of this the vessels  were all flushed and the right leg reperfused. This reperfused quickly with  excellent flow into the foot.   Attention was then placed on the left side. The left limb of the graft was  totalled behind the mesocolon and the left ureter. The left external iliac  artery was divided transversely just beyond its origin. This was a poor  caliber vessel and therefore was trimmed more distally to a better  quality  vessel. The left limb of the graft  was divided and anastomosed end-to-end  to the left external iliac artery using running 4-0 Prolene suture. At  completion of this anastomosis the graft was flushed and clamps were  removed.  Initially the left foot was noted to be perfused quite slowly.  Doppler  signal at the left ankle was monophasic.   At this time the left aorto iliac limb was controlled with clamps. A small  transverse incision was made in it and a 3 and 4 Fogarty catheter were  passed throughout the length of the left leg. No thrombus was returned. The  transverse graft incision was closed with running 5-0 Prolene suture and  clamps were removed. The left leg revealed  improved perfusion with an  improved  Doppler  signal at the left ankle.   The inferior mesenteric artery was then  examined and excellent backbleeding  was present and this was ligated with 2-0 silk tie. The patient  was then  administered 50 mg of Protamine intravenously. Adequate hemostasis was  obtained. Sponge and instrument counts were correct.   The retroperitoneum was closed over the graft  with running 3-0 Vicryl  suture. At completion of reperitonealization, the abdomen was examined to  ensure there were no retained instruments or sponges. There was no evidence  of ongoing bleeding.   The midline fascia was then closed at the xiphoid with interrupted figure-of-  8 #1 Ethibond suture. The abdominal fascia was then closed with running #1  PDS suture. The subcutaneous tissue was irrigated with saline solution and  adequate hemostasis was achieved with electrocautery. The skin was then  closed with staples. Sterile dressings were applied.   The patient was transferred directly to the surgical intensive care unit in  stable condition. Evaluation revealed  palpable posterior tibial pulses  bilaterally.                                              Balinda Quails, M.D.    PGH/MEDQ  D:  10/08/2003  T:  10/09/2003  Job:  [id number]   cc:   Colleen Can. Deborah Chalk, M.D.  Fax: [phone]

## 2011-05-02 ENCOUNTER — Other Ambulatory Visit (INDEPENDENT_AMBULATORY_CARE_PROVIDER_SITE_OTHER): Payer: Self-pay | Admitting: General Surgery

## 2011-05-02 DIAGNOSIS — Z931 Gastrostomy status: Secondary | ICD-10-CM

## 2011-05-03 ENCOUNTER — Ambulatory Visit (HOSPITAL_COMMUNITY)
Admission: RE | Admit: 2011-05-03 | Discharge: 2011-05-03 | Disposition: A | Discharge status: Home / Self Care | Payer: Medicare Other | Source: Ambulatory Visit | Attending: General Surgery | Admitting: General Surgery

## 2011-05-03 DIAGNOSIS — Z931 Gastrostomy status: Secondary | ICD-10-CM

## 2011-05-03 DIAGNOSIS — Z431 Encounter for attention to gastrostomy: Secondary | ICD-10-CM | POA: Insufficient documentation

## 2011-05-03 MED ORDER — IOHEXOL 300 MG/ML  SOLN
50.0000 mL | Freq: Once | INTRAMUSCULAR | Status: AC | PRN
  Administered 2011-05-03: 5 mL via INTRAVENOUS

## 2011-05-30 ENCOUNTER — Encounter: Payer: Self-pay | Admitting: Nurse Practitioner

## 2011-06-02 ENCOUNTER — Other Ambulatory Visit: Payer: Self-pay | Admitting: Gastroenterology

## 2011-06-02 DIAGNOSIS — K7689 Other specified diseases of liver: Secondary | ICD-10-CM

## 2011-06-05 ENCOUNTER — Encounter: Payer: Self-pay | Admitting: Nurse Practitioner

## 2011-06-05 ENCOUNTER — Ambulatory Visit (INDEPENDENT_AMBULATORY_CARE_PROVIDER_SITE_OTHER): Payer: Medicare Other | Admitting: Nurse Practitioner

## 2011-06-05 VITALS — BP 118/60 | HR 68 | Ht 68.0 in | Wt 125.1 lb

## 2011-06-05 DIAGNOSIS — I351 Nonrheumatic aortic (valve) insufficiency: Secondary | ICD-10-CM | POA: Insufficient documentation

## 2011-06-05 DIAGNOSIS — E785 Hyperlipidemia, unspecified: Secondary | ICD-10-CM | POA: Insufficient documentation

## 2011-06-05 DIAGNOSIS — I10 Essential (primary) hypertension: Secondary | ICD-10-CM

## 2011-06-05 DIAGNOSIS — I359 Nonrheumatic aortic valve disorder, unspecified: Secondary | ICD-10-CM

## 2011-06-05 NOTE — Assessment & Plan Note (Signed)
Blood pressure is good. His wife will continue to monitor.

## 2011-06-05 NOTE — Assessment & Plan Note (Signed)
His last echo was in July of 2011. It was felt to best manage him medically. We will consider repeating the echo in 6 months when he sees Dr. [name]. He is having GI issues and hopefully will not need surgical intervention. His nutrition status remains very concerning. I feel like he still has some issues related to his encephalopathy. But if surgery is needed, will have him see Dr. [name] sooner. He does not have known coronary disease per remote cath in 2006. Patient and his wife are agreeable to this plan and will call if any problems develop in the interim.

## 2011-06-05 NOTE — Assessment & Plan Note (Signed)
He is off of his statin. Now with reported increase in his LFT's. MRI study is pending. He has had considerable weight loss. Labs are checked by his PCP.

## 2011-06-05 NOTE — Progress Notes (Signed)
Corey [name] Date of Birth: [birth year]   History of Present Illness: Corey [name] is seen today for his 6 month appointment. He is seen for Dr. [name]. He is a former patient of Dr. [name]. He has had a rough time over the last several months. He now has a PEG tube in place due to achalasia. His wife is providing his care. She is worn out. His weight got down to 117 and is now up to the 120's. He is not having chest pain. He is not short of breath. His blood pressure is excellent and his wife holds his ACE most of the time.  Several of his other medicines have been stopped. He is not dizzy He will be having an MRI of his abdomen soon. There is concern for some cysts on his liver and questionable dilated ducts. He does still have his gallbladder. From a cardiac standpoint, he seems to be ok. No chest pain. No shortness of breath. He is not dizzy. He has AI. His last echo was in 2011. It was basically unchanged from his study in 2010. He does have mild aortic root dilatation and marked AI. It was felt that he was best managed medically.   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 He had a very prolonged hospitalization. He does not tolerate surgical procedures.   Current Outpatient Prescriptions on File Prior to Visit  Medication Sig Dispense Refill  . ALPRAZolam (XANAX) 0.5 MG tablet Take 0.25 mg by mouth as needed.        Marland Kitchen aspirin 81 MG tablet Take 81 mg by mouth daily.        . fosinopril (MONOPRIL) 20 MG tablet Take 20 mg by mouth daily.        Marland Kitchen DISCONTD: Ascorbic Acid (VITAMIN C PO) Take by mouth daily.        Marland Kitchen DISCONTD: CALCIUM PO Take by mouth daily.        Marland Kitchen DISCONTD: Cholecalciferol (VITAMIN D PO) Take by mouth daily.        Marland Kitchen DISCONTD: dextromethorphan-guaiFENesin (MUCINEX DM) 30-600 MG per 12 hr tablet Take 1 tablet by mouth every 12 (twelve) hours.        Marland Kitchen DISCONTD: hydrochlorothiazide (,MICROZIDE/HYDRODIURIL,) 12.5 MG capsule Take 12.5 mg by  mouth daily.        Marland Kitchen DISCONTD: loratadine (CLARITIN) 10 MG tablet Take 10 mg by mouth daily.        Marland Kitchen DISCONTD: metoprolol succinate (TOPROL-XL) 25 MG 24 hr tablet Take 25 mg by mouth daily.        Marland Kitchen DISCONTD: omeprazole (PRILOSEC OTC) 20 MG tablet Take 20 mg by mouth daily.        Marland Kitchen DISCONTD: simvastatin (ZOCOR) 80 MG tablet Take 80 mg by mouth at bedtime.          Allergies  Allergen Reactions  . Iohexol     Past Medical History  Diagnosis Date  . Aortic insufficiency   . Achalasia, esophageal     s/p PEG tube  . Dysphagia   . Hypertension   . Encephalopathy     Post AAA surgery  . Hyperlipidemia   . Dementia   . Bruises easily   . PVD (peripheral vascular disease)   . AAA (abdominal aortic aneurysm)     Past Surgical History  Procedure Date  . Cardiac catheterization 09/19/2005    NORMAL. EF 65%  . Abdominal aortic aneurysm repair   .  Transurethral resection of prostate   . Hernia repair   . Peg placement 2012    History  Smoking status  . Former Smoker  . Quit date: 12/11/1982  Smokeless tobacco  . Never Used    History  Alcohol Use No    Family History  Problem Relation Age of Onset  . Aneurysm Mother   . Stomach cancer Father     Review of Systems: The review of systems is positive for poor sleep. He has a tendency to stay up all night and sleep during the day.  All other systems were reviewed and are negative.  Physical Exam: BP 118/60  Pulse 68  Ht 5\' 8"  (1.727 m)  Wt 125 lb 2 oz (56.756 kg)  BMI 19.03 kg/m2 Patient is very pleasant and in no acute distress. He is alert and appropriate. He is thin. Skin is warm and dry. Color is normal.  HEENT is unremarkable. Normocephalic/atraumatic. PERRL. Sclera are nonicteric. Neck is supple. No masses. No JVD. Lungs are clear. Cardiac exam shows a regular rate and rhythm. He has a 3/6 murmur of AI with a soft systolic outflow murmur. Abdomen is soft. Extremities are without edema. Gait and ROM are  intact. No gross neurologic deficits noted.  LABORATORY DATA: N/A   Assessment / Plan:

## 2011-06-05 NOTE — Patient Instructions (Signed)
Stay on your current medicines We will see you back in 6 months. You will see Dr. Marca Ancona

## 2011-06-08 ENCOUNTER — Ambulatory Visit
Admission: RE | Admit: 2011-06-08 | Discharge: 2011-06-08 | Disposition: A | Discharge status: Home / Self Care | Payer: Medicare Other | Source: Ambulatory Visit | Attending: Gastroenterology | Admitting: Gastroenterology

## 2011-06-08 DIAGNOSIS — K7689 Other specified diseases of liver: Secondary | ICD-10-CM

## 2011-06-08 MED ORDER — GADOBENATE DIMEGLUMINE 529 MG/ML IV SOLN
10.0000 mL | Freq: Once | INTRAVENOUS | Status: AC | PRN
  Administered 2011-06-08: 10 mL via INTRAVENOUS

## 2011-08-31 LAB — SODIUM: Sodium: 131 — ABNORMAL LOW

## 2011-08-31 LAB — BASIC METABOLIC PANEL
BUN: 18
GFR calc non Af Amer: >60
Glucose, Bld: 69 — ABNORMAL LOW
Potassium: 4.5

## 2011-08-31 LAB — PROTIME-INR: INR: 1

## 2011-09-04 LAB — URINALYSIS, ROUTINE W REFLEX MICROSCOPIC
Glucose, UA: NEGATIVE
Ketones, ur: 15 — AB
Nitrite: POSITIVE — AB
pH: 6

## 2011-09-04 LAB — URINE CULTURE: Colony Count: 2000

## 2011-09-04 LAB — URINE MICROSCOPIC-ADD ON

## 2011-09-04 LAB — CBC
HCT: 38.7 — ABNORMAL LOW
MCV: 93.2
Platelets: 229
WBC: 4.5

## 2011-09-04 LAB — BASIC METABOLIC PANEL
BUN: 12
Chloride: 107
Glucose, Bld: 106 — ABNORMAL HIGH
Potassium: 3.4 — ABNORMAL LOW

## 2011-09-12 ENCOUNTER — Other Ambulatory Visit (INDEPENDENT_AMBULATORY_CARE_PROVIDER_SITE_OTHER): Payer: Self-pay | Admitting: General Surgery

## 2011-09-12 ENCOUNTER — Telehealth (INDEPENDENT_AMBULATORY_CARE_PROVIDER_SITE_OTHER): Payer: Self-pay | Admitting: General Surgery

## 2011-09-12 ENCOUNTER — Ambulatory Visit (HOSPITAL_COMMUNITY)
Admission: RE | Admit: 2011-09-12 | Discharge: 2011-09-12 | Disposition: A | Discharge status: Home / Self Care | Payer: Medicare Other | Source: Ambulatory Visit | Attending: General Surgery | Admitting: General Surgery

## 2011-09-12 DIAGNOSIS — K9423 Gastrostomy malfunction: Secondary | ICD-10-CM

## 2011-09-12 DIAGNOSIS — Z431 Encounter for attention to gastrostomy: Secondary | ICD-10-CM | POA: Insufficient documentation

## 2011-09-12 NOTE — Telephone Encounter (Signed)
His wife called me to notify that his PEG tube had come out in his sleep.  It was noted to be in place about 2 hours prior to calling.  She was given a foley and syringe to replace the tube in the event that it came out but wanted to confirm that this is what she should do.  She states that it has been in place since Feb or March.  I recommended coming to the ER so we could replace the tube and do a tube study prior to use or if she felt comfortable trying to place the foley that she could try as well.  Regardless of who placed the tube, I instructed her not to use it until we were able to confirm placement with a tube study. She agreed to call our office in the morning so we could set up a tube check or formal tube replacement.  She was unsure if she was going to have the tube placed by her or if she was going to come to the ER.

## 2011-09-22 LAB — CBC
MCHC: 35
MCV: 92.2
Platelets: 170
RDW: 14.6 — ABNORMAL HIGH

## 2011-09-22 LAB — DIFFERENTIAL
Eosinophils Relative: 1
Lymphocytes Relative: 13
Lymphs Abs: 0.8
Monocytes Absolute: 0.5
Neutro Abs: 4.8

## 2011-09-22 LAB — COMPREHENSIVE METABOLIC PANEL
AST: 72 — ABNORMAL HIGH
Albumin: 3.9
BUN: 26 — ABNORMAL HIGH
Calcium: 8.9
Creatinine, Ser: 1
GFR calc Af Amer: >60

## 2011-09-22 LAB — PROTIME-INR: Prothrombin Time: 15.3 — ABNORMAL HIGH

## 2011-12-01 ENCOUNTER — Ambulatory Visit (HOSPITAL_COMMUNITY)
Admission: RE | Admit: 2011-12-01 | Discharge: 2011-12-01 | Disposition: A | Discharge status: Home / Self Care | Payer: Medicare Other | Source: Ambulatory Visit | Attending: Interventional Radiology | Admitting: Interventional Radiology

## 2011-12-01 ENCOUNTER — Other Ambulatory Visit (HOSPITAL_COMMUNITY): Payer: Self-pay | Admitting: Interventional Radiology

## 2011-12-01 DIAGNOSIS — Y833 Surgical operation with formation of external stoma as the cause of abnormal reaction of the patient, or of later complication, without mention of misadventure at the time of the procedure: Secondary | ICD-10-CM | POA: Insufficient documentation

## 2011-12-01 DIAGNOSIS — K9429 Other complications of gastrostomy: Secondary | ICD-10-CM | POA: Insufficient documentation

## 2011-12-01 MED ORDER — IOHEXOL 300 MG/ML  SOLN
20.0000 mL | Freq: Once | INTRAMUSCULAR | Status: AC | PRN
  Administered 2011-12-01: 20 mL

## 2011-12-01 NOTE — Procedures (Signed)
Successful fluoroscopic guided exchange and upsize of gastrostomy tube.

## 2011-12-12 DIAGNOSIS — K922 Gastrointestinal hemorrhage, unspecified: Secondary | ICD-10-CM | POA: Diagnosis present

## 2011-12-12 HISTORY — DX: Gastrointestinal hemorrhage, unspecified: K92.2

## 2011-12-22 ENCOUNTER — Encounter (INDEPENDENT_AMBULATORY_CARE_PROVIDER_SITE_OTHER): Payer: Self-pay | Admitting: Surgery

## 2011-12-22 ENCOUNTER — Ambulatory Visit (INDEPENDENT_AMBULATORY_CARE_PROVIDER_SITE_OTHER): Payer: Medicare Other | Admitting: Surgery

## 2011-12-22 VITALS — BP 168/84 | HR 88 | Temp 98.1°F | Resp 20 | Ht 69.0 in | Wt 146.4 lb

## 2011-12-22 DIAGNOSIS — K9423 Gastrostomy malfunction: Secondary | ICD-10-CM

## 2011-12-22 NOTE — Patient Instructions (Signed)
Stop your aspirin for three days. If there is no more bleeding you may resume then.  If you have any more bleeding around the PEG tube let us know. If it is when the office is closed you will need to go to the emergency room

## 2011-12-22 NOTE — Progress Notes (Signed)
Chief complaint: Bleeding from PEG site  History of present illness: This patient had a PEG tube placed last year by Dr. Ingram. It was recently upsized by the radiologist. The patient was in the gastroenterology office today because of some issues with the skin around the site. He then began bleeding bright red blood around the tube. There was no bleeding through the tube. He was sent directly to our office to be seen urgently.  Past history family history and review of systems have been noted but not read dictated into this note.  Exam: BP 168/84  Pulse 88  Temp(Src) 98.1 F (36.7 C) (Temporal)  Resp 20  Ht 5' 9" (1.753 m)  Wt 146 lb 6.4 oz (66.407 kg)  BMI 21.62 kg/m2  General: The patient is thin but generally healthy and in no distress.  Abdomen: There is a 24 French PEG tube in the left upper quadrant. The abdomen is soft and benign. All incisions are well-healed. There is blood which appears to be bright red oozing from around the tube. There are no granulations noted on the edge of the skin and the skin is all intact. There doesn't appear to be any blood in the tube itself.  Impression: Bleeding, apparently from the abdominal wall peg site without source clearly seen.  Plan: I was able to put upward traction on the tube such that the balloon compressed up against the anterior abdominal wall and stomach wall. While doing that there is no bleeding but as soon as the compression was released bleeding began again. There was no visible vessel. I therefore held pressure for about 5 minutes with the compression and there is no bleeding while doing that. I also put some topical hemostat down around the edges of the tube going into the tract of the tube. I then held pressure additional 5 minutes. At this point there is no continuing or active bleeding.  I used the silicone ring to keep the balloon tight up against the anterior abdominal wall wall.We kept the patient for an additional 20  minutes during which time there was no bleeding whatsoever. We then irrigated the feeding tube and everything was clear and there was no blood that had accumulated within the  Stomach.  I asked the patient to stop using aspirin for approximately 3 days. They are not going to do any tube feedings until late this afternoon. If they have any further bleeding asthey had earlier today when he came in they will go to the emergency room as we will need to have more Equipment to do a better exam and evaluation. I think all questions were answered. 

## 2011-12-23 ENCOUNTER — Encounter (HOSPITAL_COMMUNITY)
Admission: EM | Disposition: A | Discharge status: Home / Self Care | Payer: Self-pay | Source: Home / Self Care | Attending: Internal Medicine

## 2011-12-23 ENCOUNTER — Emergency Department (HOSPITAL_COMMUNITY): Payer: Medicare Other

## 2011-12-23 ENCOUNTER — Telehealth (INDEPENDENT_AMBULATORY_CARE_PROVIDER_SITE_OTHER): Payer: Self-pay | Admitting: General Surgery

## 2011-12-23 ENCOUNTER — Other Ambulatory Visit: Payer: Self-pay

## 2011-12-23 ENCOUNTER — Encounter (HOSPITAL_COMMUNITY): Payer: Self-pay | Admitting: *Deleted

## 2011-12-23 ENCOUNTER — Inpatient Hospital Stay (HOSPITAL_COMMUNITY)
Admission: EM | Admit: 2011-12-23 | Discharge: 2011-12-26 | DRG: 378 | Disposition: A | Discharge status: Home / Self Care | Payer: Medicare Other | Attending: Internal Medicine | Admitting: Internal Medicine

## 2011-12-23 DIAGNOSIS — K9420 Gastrostomy complication, unspecified: Secondary | ICD-10-CM

## 2011-12-23 DIAGNOSIS — I959 Hypotension, unspecified: Secondary | ICD-10-CM

## 2011-12-23 DIAGNOSIS — R52 Pain, unspecified: Secondary | ICD-10-CM

## 2011-12-23 DIAGNOSIS — K922 Gastrointestinal hemorrhage, unspecified: Secondary | ICD-10-CM | POA: Diagnosis present

## 2011-12-23 DIAGNOSIS — D62 Acute posthemorrhagic anemia: Secondary | ICD-10-CM | POA: Diagnosis present

## 2011-12-23 DIAGNOSIS — K9429 Other complications of gastrostomy: Secondary | ICD-10-CM | POA: Diagnosis present

## 2011-12-23 DIAGNOSIS — K220 Achalasia of cardia: Secondary | ICD-10-CM | POA: Diagnosis present

## 2011-12-23 DIAGNOSIS — D649 Anemia, unspecified: Secondary | ICD-10-CM | POA: Diagnosis present

## 2011-12-23 DIAGNOSIS — I10 Essential (primary) hypertension: Secondary | ICD-10-CM | POA: Insufficient documentation

## 2011-12-23 DIAGNOSIS — K921 Melena: Principal | ICD-10-CM | POA: Diagnosis present

## 2011-12-23 DIAGNOSIS — K9423 Gastrostomy malfunction: Secondary | ICD-10-CM | POA: Insufficient documentation

## 2011-12-23 DIAGNOSIS — E876 Hypokalemia: Secondary | ICD-10-CM | POA: Diagnosis not present

## 2011-12-23 DIAGNOSIS — L299 Pruritus, unspecified: Secondary | ICD-10-CM

## 2011-12-23 DIAGNOSIS — Y849 Medical procedure, unspecified as the cause of abnormal reaction of the patient, or of later complication, without mention of misadventure at the time of the procedure: Secondary | ICD-10-CM | POA: Diagnosis present

## 2011-12-23 HISTORY — PX: ESOPHAGOGASTRODUODENOSCOPY: SHX5428

## 2011-12-23 LAB — MRSA PCR SCREENING: MRSA by PCR: NEGATIVE

## 2011-12-23 LAB — COMPREHENSIVE METABOLIC PANEL
ALT: 15 U/L (ref 0–53)
AST: 17 U/L (ref 0–37)
Albumin: 2.8 g/dL — ABNORMAL LOW (ref 3.5–5.2)
CO2: 23 mEq/L (ref 19–32)
Calcium: 7.4 mg/dL — ABNORMAL LOW (ref 8.4–10.5)
Chloride: 117 mEq/L — ABNORMAL HIGH (ref 96–112)
GFR calc non Af Amer: 87 mL/min — ABNORMAL LOW (ref >90)
Sodium: 144 mEq/L (ref 135–145)

## 2011-12-23 LAB — POCT I-STAT, CHEM 8
BUN: 56 mg/dL — ABNORMAL HIGH (ref 6–23)
Chloride: 116 mEq/L — ABNORMAL HIGH (ref 96–112)
Creatinine, Ser: 0.9 mg/dL (ref 0.50–1.35)
Hemoglobin: 10.9 g/dL — ABNORMAL LOW (ref 13.0–17.0)
Potassium: 4.8 mEq/L (ref 3.5–5.1)
Sodium: 145 mEq/L (ref 135–145)

## 2011-12-23 LAB — CBC
HCT: 28.3 % — ABNORMAL LOW (ref 39.0–52.0)
Hemoglobin: 11.1 g/dL — ABNORMAL LOW (ref 13.0–17.0)
Hemoglobin: 9.5 g/dL — ABNORMAL LOW (ref 13.0–17.0)
MCH: 31.7 pg (ref 26.0–34.0)
MCH: 31.8 pg (ref 26.0–34.0)
MCHC: 33.6 g/dL (ref 30.0–36.0)
MCV: 93.4 fL (ref 78.0–100.0)
MCV: 94.6 fL (ref 78.0–100.0)
Platelets: 142 10*3/uL — ABNORMAL LOW (ref 150–400)
RBC: 3.5 MIL/uL — ABNORMAL LOW (ref 4.22–5.81)
RBC: 2.99 MIL/uL — ABNORMAL LOW (ref 4.22–5.81)
WBC: 11.9 10*3/uL — ABNORMAL HIGH (ref 4.0–10.5)

## 2011-12-23 LAB — PROTIME-INR
INR: 1.17 (ref 0.00–1.49)
Prothrombin Time: 15.1 seconds (ref 11.6–15.2)

## 2011-12-23 LAB — DIFFERENTIAL
Eosinophils Absolute: 0.1 10*3/uL (ref 0.0–0.7)
Lymphocytes Relative: 17 % (ref 12–46)
Lymphs Abs: 2 10*3/uL (ref 0.7–4.0)
Monocytes Relative: 8 % (ref 3–12)
Neutrophils Relative %: 75 % (ref 43–77)

## 2011-12-23 LAB — ABO/RH: ABO/RH(D): O POS

## 2011-12-23 LAB — TYPE AND SCREEN
ABO/RH(D): O POS
Antibody Screen: NEGATIVE

## 2011-12-23 SURGERY — EGD (ESOPHAGOGASTRODUODENOSCOPY)
Anesthesia: Moderate Sedation

## 2011-12-23 MED ORDER — SODIUM CHLORIDE 0.9 % IV BOLUS (SEPSIS)
1000.0000 mL | Freq: Once | INTRAVENOUS | Status: AC
  Administered 2011-12-23: 1000 mL via INTRAVENOUS

## 2011-12-23 MED ORDER — DIAZEPAM 5 MG/ML IJ SOLN: 5.0000 mg | Freq: Once | INTRAMUSCULAR | Status: DC

## 2011-12-23 MED ORDER — SODIUM CHLORIDE 0.9 % IV SOLN
8.0000 mg/h | INTRAVENOUS | Status: DC
  Administered 2011-12-23 – 2011-12-24 (×3): 8 mg/h via INTRAVENOUS
  Filled 2011-12-23 (×7): qty 80

## 2011-12-23 MED ORDER — MIDAZOLAM HCL 10 MG/2ML IJ SOLN
INTRAMUSCULAR | Status: DC | PRN
  Administered 2011-12-23 (×2): 2 mg via INTRAVENOUS

## 2011-12-23 MED ORDER — FENTANYL NICU IV SYRINGE 50 MCG/ML
INJECTION | INTRAMUSCULAR | Status: DC | PRN
  Administered 2011-12-23: 25 ug via INTRAVENOUS

## 2011-12-23 MED ORDER — SODIUM CHLORIDE 0.9 % IV SOLN
Freq: Once | INTRAVENOUS | Status: AC
  Administered 2011-12-23: 05:00:00 via INTRAVENOUS

## 2011-12-23 MED ORDER — PANTOPRAZOLE SODIUM 40 MG IV SOLR
INTRAVENOUS | Status: AC
  Filled 2011-12-23: qty 80

## 2011-12-23 MED ORDER — SODIUM CHLORIDE 0.9 % IV SOLN
INTRAVENOUS | Status: DC
  Administered 2011-12-23 (×2): via INTRAVENOUS
  Administered 2011-12-24: 1000 mL via INTRAVENOUS

## 2011-12-23 MED ORDER — SODIUM CHLORIDE 0.9 % IV BOLUS (SEPSIS)
500.0000 mL | Freq: Once | INTRAVENOUS | Status: AC
  Administered 2011-12-23: 500 mL via INTRAVENOUS

## 2011-12-23 MED ORDER — SODIUM CHLORIDE 0.9 % IV SOLN
80.0000 mg | Freq: Once | INTRAVENOUS | Status: AC
  Administered 2011-12-23: 80 mg via INTRAVENOUS
  Filled 2011-12-23: qty 80

## 2011-12-23 MED ORDER — BUTAMBEN-TETRACAINE-BENZOCAINE 2-2-14 % EX AERO
INHALATION_SPRAY | CUTANEOUS | Status: DC | PRN
  Administered 2011-12-23: 1 via TOPICAL

## 2011-12-23 MED ORDER — SODIUM CHLORIDE 0.9 % IV SOLN
Freq: Once | INTRAVENOUS | Status: AC
  Administered 2011-12-23: 03:00:00 via INTRAVENOUS

## 2011-12-23 MED ORDER — ONDANSETRON HCL 4 MG/2ML IJ SOLN
INTRAMUSCULAR | Status: AC
  Administered 2011-12-23: 8 mg
  Filled 2011-12-23: qty 4

## 2011-12-23 NOTE — ED Notes (Signed)
PEG tube bleeding that has not stopped.

## 2011-12-23 NOTE — Telephone Encounter (Signed)
Called with c/o bright bleeding from around his PEG tube.  He says that it is still bleeding.  I recommended that he come to the ER for evaluation and treatment.

## 2011-12-23 NOTE — Interval H&P Note (Signed)
History and Physical Interval Note:  12/23/2011 4:57 AM  Corey [name]  has presented today for surgery, with the diagnosis of * No pre-op diagnosis entered *  The various methods of treatment have been discussed with the patient and family. After consideration of risks, benefits and other options for treatment, the patient has consented to  Procedure(s): ESOPHAGOGASTRODUODENOSCOPY (EGD) as a surgical intervention .  The patients' history has been reviewed, patient examined, no change in status, stable for surgery.  I have reviewed the patients' chart and labs.  Questions were answered to the patient's satisfaction.     Corey [name]  Patient interval history reviewed.  Patient examined again. Patient with bleeding through and around PEG tube.  Plan on endoscopy for further evaluation.  Risks (bleeding, infection, bowel perforation that could require surgery, sedation-related changes in cardiopulmonary systems), benefits (identification and possible treatment of source of symptoms, exclusion of certain causes of symptoms), and alternatives (watchful waiting, radiographic imaging studies, empiric medical treatment) of upper endoscopy (EGD) were explained to patient + wife in detail and patient wishes to proceed.

## 2011-12-23 NOTE — ED Provider Notes (Signed)
History     CSN: [id number]  Arrival date & time 12/23/11  0110   First MD Initiated Contact with Patient 12/23/11 0118      Chief Complaint  Patient presents with  . Wound Check    HPI: The history is provided by the patient and the spouse.  Pt and wife report onset of bleeding around pt's peg tube site at approx 0930 yesterday. h/o placement of peg-tube 02/2011 for achalasia. Wife states pt has had tube replaced 3 times since original tube placement for problems w/ leaking. Pt was seen by Dr Madilyn Fireman and Dr Jamey Ripa yesterday afternoon. Dr Jamey Ripa was able to get bleeding stopped and pt was sent home at approx 1200. Tube functioned well, and there was no further bleeding until approx 2330. Pt presents w/ large abd dressing that had been placed by his wife prior to bedtime, saturated w/ bright red blood. There is no active bleeding at this time. Pt denies pain or nausea and reports he had a normal BM yesterday morning.   Past Medical History  Diagnosis Date  . Aortic insufficiency   . Achalasia, esophageal     s/p PEG tube  . Dysphagia   . Hypertension   . Encephalopathy     Post AAA surgery  . Hyperlipidemia   . Dementia   . Bruises easily   . PVD (peripheral vascular disease)   . AAA (abdominal aortic aneurysm)     Past Surgical History  Procedure Date  . Cardiac catheterization 09/19/2005    NORMAL. EF 65%  . Abdominal aortic aneurysm repair   . Transurethral resection of prostate   . Hernia repair   . Peg placement 2012    Family History  Problem Relation Age of Onset  . Aneurysm Mother   . Heart disease Mother   . Stomach cancer Father   . Cancer Father     colon  . Cancer Brother     lung  . Heart disease Sister     History  Substance Use Topics  . Smoking status: Former Smoker    Quit date: 12/11/1982  . Smokeless tobacco: Never Used  . Alcohol Use: No      Review of Systems  Constitutional: Negative.   HENT: Negative.   Eyes: Negative.     Respiratory: Negative.   Cardiovascular: Negative.   Gastrointestinal: Negative.   Genitourinary: Negative.   Musculoskeletal: Negative.   Skin: Negative.   Neurological: Negative.   Hematological: Negative.   Psychiatric/Behavioral: Negative.     Allergies  Iohexol  Home Medications   Current Outpatient Rx  Name Route Sig Dispense Refill  . ASPIRIN 81 MG PO TABS Oral Take 81 mg by mouth daily.      . IRBESARTAN 150 MG PO TABS Oral Take 150 mg by mouth at bedtime.    Marland Kitchen LORATADINE 10 MG PO TABS Oral Take 10 mg by mouth daily.      BP 119/64  Pulse 92  Temp(Src) 97.3 F (36.3 C) (Oral)  Resp 18  SpO2 100%  Physical Exam  Constitutional: He appears well-developed and well-nourished.  HENT:  Head: Normocephalic and atraumatic.  Eyes: Conjunctivae are normal.  Neck: Neck supple.  Cardiovascular: Normal rate.   Pulmonary/Chest: Effort normal.  Abdominal: Soft. Bowel sounds are normal.  Musculoskeletal: Normal range of motion.  Neurological: He is alert.  Skin: Skin is warm and dry.  Psychiatric: He has a normal mood and affect.    ED Course  Procedures VS  stable. I have cleaned around the peg tube and do not note active bleeding at this time. I have discussed pt w/ Dr Nicanor Alcon who has agreed to evaluate pt.  0210: Dr Nicanor Alcon reports upon her arrival to room pt now pale and hypotensive (79/39). She reports rectal exam reveals frank melena. Request Critical care be called to consult for possible admission. Abd imaging reveals proper peg tube placement and no free air. Initail hmb 10.9.  0217: I spoke w/ Dr Deterding w/ [facility] who agrees w/ consult to GI , prior to critical becoming involved. States she will remain available pending GI consult.  0330: Spoke w/ Dr Dulce Sellar who has agreed to come see pt in ED. Pt VS have stabilized. He remains AA&Ox3.   1610:  I have spoke w/ Dr Biagio Quint who has agreed to see pt in ED.  0400: Spoke w/ Dr Deterding  again w/ critical care who states she will be available if needed for critical care consult but will not consult immediately as pt currently being evaluated by GI and general surgery and status has appeared to stabilize at this time.  65: Dr Dulce Sellar and Biagio Quint are in the dept. Endoscopy team in dept preparing pt for procedure.  Labs Reviewed  POCT I-STAT, CHEM 8 - Abnormal; Notable for the following:    Chloride 116 (*)    BUN 56 (*)    Glucose, Bld 156 (*)    Hemoglobin 10.9 (*)    HCT 32.0 (*)    All other components within normal limits  OCCULT BLOOD, POC DEVICE  CBC  DIFFERENTIAL  I-STAT, CHEM 8  TYPE AND SCREEN  PROTIME-INR  LIPASE, BLOOD   No results found.   No diagnosis found.    MDM  HPI/PE and clinical course c/w GI bleed Currrently being evaluated by GI and general surgery.  Endoscopic procedure in progress       Leanne Chang, NP 12/23/11 1027

## 2011-12-23 NOTE — Op Note (Signed)
[facility] [address] [place], [place]  [address]  ENDOSCOPY PROCEDURE REPORT  PATIENT:  Corey [name], Corey [name]  MR#:  [id number] BIRTHDATE:  Nov 21, 1929, 82 yrs. old  GENDER:  male ENDOSCOPIST:  Willis Modena, MD Referred by:  Chilton Greathouse, M.D. PROCEDURE DATE:  12/23/2011 PROCEDURE:  EGD, diagnostic 43235 ASA CLASS:  Class III INDICATIONS:  blood per PEG; melena MEDICATIONS:   Cetacaine spray x 2, Fentanyl 25 mcg IV, Versed 4 mg IV  DESCRIPTION OF PROCEDURE:   After the risks benefits and alternatives of the procedure were thoroughly explained, informed consent was obtained.  The EG-[id number] ([id number]) endoscope was introduced through the mouth and advanced to the second portion of the duodenum, without limitations.  The instrument was slowly withdrawn as the mucosa was fully examined. <<PROCEDUREIMAGES>> FINDINGS:  Large distal esophageal diverticulum.  Otherwise normal esophagus.  Large volume of old and clotted blood in stomach; I was able to suction much of this out, but some large coalesced clots obscured some views in the fundus.  The internal PEG bumper was seen.  The internal bumper was loosened, and I could see what appeared to be a small ulcer with overlying clot.  Stomach otherwise normal.  Normal pylorus and duodenum to the second portion. No fresh or active bleeding was seen.  ENDOSCOPIC IMPRESSION:    1.  Distal esophageal diverticulum. 2.  Likely small pressure-related ischemic ulcer at internal PEG balloon bumper site with clot.  No             active bleeding. 3.  Otherwise normal endoscopy, albeit with obscured views of fundus due to clots. 4.  Overall, based on endoscopy results and after discussing case further with wife, I suspect the patient was keeping his internal bumper too tight after his most recent radiology-assisted PEG             replacement, which likely caused a degree of pressure ulceration and,  subsequently, bleeding.  RECOMMENDATIONS:      1.  Watch for potential complications of procedure 2.  As long as there is no further active bleeding (and there is not at this time), I would keep the             PEG on the looser side (external bumper is currently at   3 cm), so as to minimize risk of further             pressure-related ischemia. 3.  However, in the short-term, should be develop recurrent acute active bleeding, the internal             bumper might need to be temporarily tightened (as it was today in the surgical office) so as to             provide a tamponade-like effect.            4. Based on what I am currently seeing, I don't see a ready role for endoscopy in the event of             rebleeding. 5.  Would hold off on feeding per PEG for another 6-8 hours, to ensure no further bleeding. 6.  Agree with medical admission and PPI therapy (Protonix gtt for the next 12-24 hours, then             transition thereafter).  REPEAT EXAM:  No  ______________________________ Willis Modena  CC:  n. eSIGNEDWillis Modena at 12/23/2011 05:51 AM  Wilburt Finlay, [phone]

## 2011-12-23 NOTE — H&P (Signed)
Corey [name] is an 78 y.o. male.   PCP:   Hoyle Sauer, MD, MD   Chief Complaint:  GIB, Hypotension   HPI: 27 M with MMP who has a PEG b/c of Achylasia placed in March, 2012 by Dr Derrell Lolling and has needed it replaced 3  Times.  It was recently upsized by IR.  He was seen by GI and [facility] (Dr Jamey Ripa) as an outpt on 12/22/11 b/c of issues with bleeding/BRB oozing @ the PEG site.  Dr Jamey Ripa used the balloon to tamponade what he thought was an ab wall bleeder.  He followed that up with topical hemostat and silicone ring to continue the tamponade.  He was sent home with plans to hold ASA for 3 days and hold TFs for a few hrs.  Later he developed increased bleeding @ the PEG and called [facility] and sent to ED.  He was evaluated by ED MD, Dr Dulce Sellar and [facility].  During Eval he had a witnessed episode of melena - none prior. He denied any abdominal pain, nausea, vomiting. He takes ASA/day, but no NSAIDs.  B/C of the Bleeding and Melena he was endoscoped and found small pressure ulcer under balloon. No active bleeding but he did have some clot in his stomach. No surgery is required at this time but surgery has recommended admission and continued observation with serial HGB. His bumper was loosened.  Both GI and [facility] felt that due to the complexities of his med issues that they would want him on the Internal medicine Service.  As he awoke from procedure and while anesthesia wore off he dropped his BPs into 70's and required IVF boluses.  Blood is on order but has not needed to be given.  He is currently comfortable and HD stable.     Past Medical History:  Past Medical History  Diagnosis Date  . Aortic insufficiency   . Achalasia, esophageal     s/p PEG tube  . Dysphagia   . Hypertension   . Encephalopathy     Post AAA surgery  . Hyperlipidemia   . Dementia   . Bruises easily   . PVD (peripheral vascular disease)   . AAA (abdominal aortic aneurysm)    1. Food impaction secondary to possible achalasia and  esophageal  diverticulum, status post removal.  2. Hypertension.  3. Mild chronic obstructive pulmonary disease.  4. Aortic insufficiency.  5. Abdominal aortic aneurysm and iliac repair in October 2004, complicated by marked confusion.  6. Known aortic insufficiency. Mod -severe aortic regurgitation, a systolic anterior motion of the mitral valve, mildly dilated aortic root, mild LVH, mild pulmonary hypertension, mild left atrial enlargement with impaired LV relaxation.  7. Hyperlipidemia.  8. Bilateral inguinal hernia surgery.  9. Remote history of hydrocele repair.   Past Surgical History  Procedure Date  . Cardiac catheterization 09/19/2005    NORMAL. EF 65%  . Abdominal aortic aneurysm repair   . Transurethral resection of prostate   . Hernia repair   . Peg placement 2012      Allergies:   Allergies  Allergen Reactions  . Iohexol Hives     Medications: Prior to Admission medications   Medication Sig Start Date End Date Taking? Authorizing Provider  aspirin 81 MG tablet Take 81 mg by mouth daily.     Yes Historical Provider, MD  irbesartan (AVAPRO) 150 MG tablet Take 150 mg by mouth at bedtime.   Yes Historical Provider, MD  loratadine (CLARITIN) 10 MG tablet  Take 10 mg by mouth daily.   Yes Historical Provider, MD     Medications Prior to Admission  Medication Dose Route Frequency Provider Last Rate Last Dose  . 0.9 %  sodium chloride infusion   Intravenous Once April K Palumbo-Rasch, MD      . 0.9 %  sodium chloride infusion   Intravenous Once April K Palumbo-Rasch, MD 125 mL/hr at 12/23/11 0441    . ondansetron (ZOFRAN) 4 MG/2ML injection        8 mg at 12/23/11 0301  . pantoprazole (PROTONIX) 80 mg in sodium chloride 0.9 % 100 mL IVPB  80 mg Intravenous Once [name], MD      . pantoprazole (PROTONIX) 80 mg in sodium chloride 0.9 % 250 mL infusion  8 mg/hr Intravenous Continuous [name], MD      . sodium chloride 0.9 % bolus 1,000 mL  1,000 mL  Intravenous Once April K Palumbo-Rasch, MD   1,000 mL at 12/23/11 0301  . sodium chloride 0.9 % bolus 1,000 mL  1,000 mL Intravenous Once April K Palumbo-Rasch, MD   1,000 mL at 12/23/11 0330  . sodium chloride 0.9 % bolus 1,000 mL  1,000 mL Intravenous Once April K Palumbo-Rasch, MD   1,000 mL at 12/23/11 0345  . sodium chloride 0.9 % bolus 1,000 mL  1,000 mL Intravenous Once April K Palumbo-Rasch, MD   1,000 mL at 12/23/11 0400  . sodium chloride 0.9 % bolus 1,000 mL  1,000 mL Intravenous Once April K Palumbo-Rasch, MD   1,000 mL at 12/23/11 0350  . DISCONTD: butamben-tetracaine-benzocaine (CETACAINE) spray    PRN [name], MD   1 spray at 12/23/11 0503  . DISCONTD: diazepam (VALIUM) injection 5 mg  5 mg Intravenous Once Leanne Chang, NP      . DISCONTD: fentaNYL NICU IV Syringe 50 mcg/mL    PRN [name], MD   25 mcg at 12/23/11 0505  . DISCONTD: midazolam (VERSED) injection    PRN [name], MD   2 mg at 12/23/11 0511   Medications Prior to Admission  Medication Sig Dispense Refill  . aspirin 81 MG tablet Take 81 mg by mouth daily.        . irbesartan (AVAPRO) 150 MG tablet Take 150 mg by mouth at bedtime.      Marland Kitchen loratadine (CLARITIN) 10 MG tablet Take 10 mg by mouth daily.         Social History:  reports that he quit smoking about 29 years ago. He has never used smokeless tobacco. He reports that he does not drink alcohol or use illicit drugs.  Family History: Family History  Problem Relation Age of Onset  . Aneurysm Mother   . Heart disease Mother   . Stomach cancer Father   . Cancer Father     colon  . Cancer Brother     lung  . Heart disease Sister     Review of Systems:  Review of Systems - See HPI. No CP or SOB. No Urinary Sxs. Weakness, fatigue. Full ROS obtained. Does TFs HN-2 8 oz 5 cans per day and some POs but he spits up most of it.  Physical Exam:  Blood pressure 134/51, pulse 78, temperature 97.3 F (36.3 C), temperature source  Oral, resp. rate 13, SpO2 100.00%. Filed Vitals:   12/23/11 0400 12/23/11 0415 12/23/11 0430 12/23/11 0451  BP:  109/51 134/51   Pulse: 74 75 78  Temp:      TempSrc:      Resp: 19 18 13    SpO2: 100% 100% 99% 100%   General appearance: alert, cooperative and cachectic wearing FIO2. Head: Normocephalic, without obvious abnormality, atraumatic Eyes: conjunctivae/corneas clear. PERRL, EOM's intact.  Nose: Nares normal. Septum midline. Mucosa normal. No drainage or sinus tenderness. Throat: lips, mucosa, and tongue normal; teeth and gums normal Neck: no adenopathy, no carotid bruit, no JVD and thyroid not enlarged, symmetric, no tenderness/mass/nodules Resp: clear to auscultation bilaterally Cardio: Regular with m GI: soft, non-tender; bowel sounds normal; no masses,  no organomegaly.  PEG - CDI - scar noted Extremities: extremities normal, atraumatic, no cyanosis or edema Pulses: 2+ and symmetric Lymph nodes: Cervical adenopathy: no cervical lymphadenopathy Neurologic: Alert and oriented X 3, normal strength and tone. Normal symmetric reflexes.     Labs on Admission:   [facility] 12/23/11 0302  NA 145  K 4.8  CL 116*  CO2 --  GLUCOSE 156*  BUN 56*  CREATININE 0.90  CALCIUM --  MG --  PHOS --   No results found for this basename: AST:2,ALT:2,ALKPHOS:2,BILITOT:2,PROT:2,ALBUMIN:2 in the last 72 hours  Basename 12/23/11 0254  LIPASE 35  AMYLASE --    Basename 12/23/11 0302 12/23/11 0254  WBC -- 11.9*  NEUTROABS -- 8.9*  HGB 10.9* 11.1*  HCT 32.0* 32.7*  MCV -- 93.4  PLT -- 142*   No results found for this basename: CKTOTAL:3,CKMB:3,CKMBINDEX:3,TROPONINI:3 in the last 72 hours Lab Results  Component Value Date   INR 1.17 12/23/2011   INR 1.16 02/07/2011   INR 1.0 12/24/2007     LAB RESULT POCT:  Results for orders placed during the hospital encounter of 12/23/11  CBC      Component Value Range   WBC 11.9 (*) 4.0 - 10.5 (K/uL)   RBC 3.50 (*) 4.22 - 5.81 (MIL/uL)    Hemoglobin 11.1 (*) 13.0 - 17.0 (g/dL)   HCT 40.9 (*) 81.1 - 52.0 (%)   MCV 93.4  78.0 - 100.0 (fL)   MCH 31.7  26.0 - 34.0 (pg)   MCHC 33.9  30.0 - 36.0 (g/dL)   RDW 91.4  78.2 - 95.6 (%)   Platelets 142 (*) 150 - 400 (K/uL)  DIFFERENTIAL      Component Value Range   Neutrophils Relative 75  43 - 77 (%)   Neutro Abs 8.9 (*) 1.7 - 7.7 (K/uL)   Lymphocytes Relative 17  12 - 46 (%)   Lymphs Abs 2.0  0.7 - 4.0 (K/uL)   Monocytes Relative 8  3 - 12 (%)   Monocytes Absolute 0.9  0.1 - 1.0 (K/uL)   Eosinophils Relative 1  0 - 5 (%)   Eosinophils Absolute 0.1  0.0 - 0.7 (K/uL)   Basophils Relative 0  0 - 1 (%)   Basophils Absolute 0.0  0.0 - 0.1 (K/uL)  TYPE AND SCREEN      Component Value Range   ABO/RH(D) O POS     Antibody Screen NEG     Sample Expiration 12/26/2011    PROTIME-INR      Component Value Range   Prothrombin Time 15.1  11.6 - 15.2 (seconds)   INR 1.17  0.00 - 1.49   LIPASE, BLOOD      Component Value Range   Lipase 35  11 - 59 (U/L)  OCCULT BLOOD, POC DEVICE      Component Value Range   Fecal Occult Bld POSITIVE    POCT  I-STAT, CHEM 8      Component Value Range   Sodium 145  135 - 145 (mEq/L)   Potassium 4.8  3.5 - 5.1 (mEq/L)   Chloride 116 (*) 96 - 112 (mEq/L)   BUN 56 (*) 6 - 23 (mg/dL)   Creatinine, Ser 1.61  0.50 - 1.35 (mg/dL)   Glucose, Bld 096 (*) 70 - 99 (mg/dL)   Calcium, Ion 0.45  4.09 - 1.32 (mmol/L)   TCO2 23  0 - 100 (mmol/L)   Hemoglobin 10.9 (*) 13.0 - 17.0 (g/dL)   HCT 81.1 (*) 91.4 - 52.0 (%)  LACTIC ACID, PLASMA      Component Value Range   Lactic Acid, Venous 1.7  0.5 - 2.2 (mmol/L)      Radiological Exams on Admission: Dg Chest Port 1 View  12/23/2011  *RADIOLOGY REPORT*  Clinical Data: Bleeding about the PEG tube; assess for free air.  PORTABLE CHEST - 1 VIEW  Comparison: Chest radiograph performed 02/07/2011  Findings: The lungs are well-aerated.  Mild chronic lung changes are noted.  There is no evidence of focal  opacification, pleural effusion or pneumothorax.  The cardiomediastinal silhouette is grossly normal in size; prominence of the ascending thoracic aortic silhouette appears grossly stable from the prior study, and may reflect either a prominent ascending aorta or tortuosity of the thoracic aorta.  No acute osseous abnormalities are seen.  No free intra-abdominal air is seen.  IMPRESSION:  1.  No acute cardiopulmonary process seen; stable prominence or tortuosity of the ascending thoracic aorta. 2.  No free intra-abdominal air seen.  Original Report Authenticated By: Tonia Ghent, M.D.   Dg Abd Portable 1v  12/23/2011  *RADIOLOGY REPORT*  Clinical Data: Bleeding about the PEG tube.  PORTABLE ABDOMEN - 1 VIEW  Comparison: Abdominal radiograph performed 12/16/2004  Findings: The patient's Panda tube is noted overlying the left upper quadrant, in the expected region of the stomach.  However, its location cannot be confirmed by radiograph; if there is significant clinical concern for abnormal positioning, a follow-up radiograph could be performed following injection of a small amount of contrast into the tube.  The visualized bowel gas pattern is grossly unremarkable.  No free intra-abdominal air is seen.  No acute osseous abnormalities are identified.  IMPRESSION: Panda tube noted overlying the left upper quadrant, in the expected region of the stomach.  Original Report Authenticated By: Tonia Ghent, M.D.      No orders found for this or any previous visit.   Assessment/Plan Principal Problem:  *Upper GI bleed Active Problems:  HTN (hypertension)  PEG tube malfunction  Anemia Hypotension improved Post Anesthesia and after IVF Bolus.  He fell asleep and BP dropped and he got 500 cc bolus ordered.  HD is relatively stable and MS is fine. Full code. Admit to stepdown and watch serial H&H and blood pressure. See Orders. Hold Meds except the protonix. He already has a current T and S NPO except  sips and chips to mouth only  Per Dr Chanda Busing: 1. Watch for potential complications of procedure  2. As long as there is no further active bleeding (and there is not at this time), I would keep the PEG on the looser side (external bumper is currently at  3 cm), so as to minimize risk of further pressure-related ischemia.  3. However, in the short-term, should be develop recurrent acute active bleeding, the internal bumper might need to be temporarily tightened (as it was today in the surgical  office) so as to provide a tamponade-like effect.  4. Based on what I am currently seeing, I don't see a ready role for endoscopy in the event of rebleeding.  5. Would hold off on feeding per PEG for another 6-8 hours, to ensure no further bleeding.  6. Agree with medical admission and PPI therapy (Protonix gtt for the next 12-24 hours, then transition thereafter).  DVT Proph - Squeezers.  Prince Couey M 12/23/2011, 6:42 AM

## 2011-12-23 NOTE — Consult Note (Signed)
Reason for Consult:bleeding PEG tube Referring Physician: Carless Slatten is an 78 y.o. male.  HPI: this patient was seen earlier in the day by Dr. Jamey Ripa for evaluation of bleeding from his PEG tube site. He recently had his PEG sized begin having issues with his PEG earlier today. He was seen in our office for evaluation and it was felt that he had some bleeding from the skin around the entry site. Silver nitrate was applied and the tube was adjusted and the bleeding had apparently stopped. He went home and continued to have bleeding from the site which saturated a dressing and he called me earlier further recommendations. I recommended that he come in to the emergency room for evaluation which he did. In the emergency room he was evaluated by the ER physician and she noted some coffee-ground type contents in his PEG tube as well as some melena in his stools. The patient is otherwise asymptomatic and denies any abdominal pain, dizziness, shortness of breath, or chest pain. He is tolerating diet without difficulty.  Past Medical History  Diagnosis Date  . Aortic insufficiency   . Achalasia, esophageal     s/p PEG tube  . Dysphagia   . Hypertension   . Encephalopathy     Post AAA surgery  . Hyperlipidemia   . Dementia   . Bruises easily   . PVD (peripheral vascular disease)   . AAA (abdominal aortic aneurysm)     Past Surgical History  Procedure Date  . Cardiac catheterization 09/19/2005    NORMAL. EF 65%  . Abdominal aortic aneurysm repair   . Transurethral resection of prostate   . Hernia repair   . Peg placement 2012    Family History  Problem Relation Age of Onset  . Aneurysm Mother   . Heart disease Mother   . Stomach cancer Father   . Cancer Father     colon  . Cancer Brother     lung  . Heart disease Sister     Social History:  reports that he quit smoking about 29 years ago. He has never used smokeless tobacco. He reports that he does not drink alcohol or use  illicit drugs.  Allergies:  Allergies  Allergen Reactions  . Iohexol Hives    Medications: I have reviewed the patient's current medications.  Results for orders placed during the hospital encounter of 12/23/11 (from the past 48 hour(s))  CBC     Status: Abnormal   Collection Time   12/23/11  2:54 AM      Component Value Range Comment   WBC 11.9 (*) 4.0 - 10.5 (K/uL)    RBC 3.50 (*) 4.22 - 5.81 (MIL/uL)    Hemoglobin 11.1 (*) 13.0 - 17.0 (g/dL)    HCT 78.2 (*) 95.6 - 52.0 (%)    MCV 93.4  78.0 - 100.0 (fL)    MCH 31.7  26.0 - 34.0 (pg)    MCHC 33.9  30.0 - 36.0 (g/dL)    RDW 21.3  08.6 - 57.8 (%)    Platelets 142 (*) 150 - 400 (K/uL)   DIFFERENTIAL     Status: Abnormal   Collection Time   12/23/11  2:54 AM      Component Value Range Comment   Neutrophils Relative 75  43 - 77 (%)    Neutro Abs 8.9 (*) 1.7 - 7.7 (K/uL)    Lymphocytes Relative 17  12 - 46 (%)    Lymphs Abs 2.0  0.7 - 4.0 (K/uL)    Monocytes Relative 8  3 - 12 (%)    Monocytes Absolute 0.9  0.1 - 1.0 (K/uL)    Eosinophils Relative 1  0 - 5 (%)    Eosinophils Absolute 0.1  0.0 - 0.7 (K/uL)    Basophils Relative 0  0 - 1 (%)    Basophils Absolute 0.0  0.0 - 0.1 (K/uL)   TYPE AND SCREEN     Status: Normal   Collection Time   12/23/11  2:54 AM      Component Value Range Comment   ABO/RH(D) O POS      Antibody Screen NEG      Sample Expiration 12/26/2011     PROTIME-INR     Status: Normal   Collection Time   12/23/11  2:54 AM      Component Value Range Comment   Prothrombin Time 15.1  11.6 - 15.2 (seconds)    INR 1.17  0.00 - 1.49    LIPASE, BLOOD     Status: Normal   Collection Time   12/23/11  2:54 AM      Component Value Range Comment   Lipase 35  11 - 59 (U/L)   OCCULT BLOOD, POC DEVICE     Status: Normal   Collection Time   12/23/11  3:02 AM      Component Value Range Comment   Fecal Occult Bld POSITIVE     POCT I-STAT, CHEM 8     Status: Abnormal   Collection Time   12/23/11  3:02 AM       Component Value Range Comment   Sodium 145  135 - 145 (mEq/L)    Potassium 4.8  3.5 - 5.1 (mEq/L)    Chloride 116 (*) 96 - 112 (mEq/L)    BUN 56 (*) 6 - 23 (mg/dL)    Creatinine, Ser 1.61  0.50 - 1.35 (mg/dL)    Glucose, Bld 096 (*) 70 - 99 (mg/dL)    Calcium, Ion 0.45  1.12 - 1.32 (mmol/L)    TCO2 23  0 - 100 (mmol/L)    Hemoglobin 10.9 (*) 13.0 - 17.0 (g/dL)    HCT 40.9 (*) 81.1 - 52.0 (%)   LACTIC ACID, PLASMA     Status: Normal   Collection Time   12/23/11  3:20 AM      Component Value Range Comment   Lactic Acid, Venous 1.7  0.5 - 2.2 (mmol/L)     Dg Chest Port 1 View  12/23/2011  *RADIOLOGY REPORT*  Clinical Data: Bleeding about the PEG tube; assess for free air.  PORTABLE CHEST - 1 VIEW  Comparison: Chest radiograph performed 02/07/2011  Findings: The lungs are well-aerated.  Mild chronic lung changes are noted.  There is no evidence of focal opacification, pleural effusion or pneumothorax.  The cardiomediastinal silhouette is grossly normal in size; prominence of the ascending thoracic aortic silhouette appears grossly stable from the prior study, and may reflect either a prominent ascending aorta or tortuosity of the thoracic aorta.  No acute osseous abnormalities are seen.  No free intra-abdominal air is seen.  IMPRESSION:  1.  No acute cardiopulmonary process seen; stable prominence or tortuosity of the ascending thoracic aorta. 2.  No free intra-abdominal air seen.  Original Report Authenticated By: Tonia Ghent, M.D.   Dg Abd Portable 1v  12/23/2011  *RADIOLOGY REPORT*  Clinical Data: Bleeding about the PEG tube.  PORTABLE ABDOMEN - 1 VIEW  Comparison: Abdominal radiograph  performed 12/16/2004  Findings: The patient's Panda tube is noted overlying the left upper quadrant, in the expected region of the stomach.  However, its location cannot be confirmed by radiograph; if there is significant clinical concern for abnormal positioning, a follow-up radiograph could be performed  following injection of a small amount of contrast into the tube.  The visualized bowel gas pattern is grossly unremarkable.  No free intra-abdominal air is seen.  No acute osseous abnormalities are identified.  IMPRESSION: Panda tube noted overlying the left upper quadrant, in the expected region of the stomach.  Original Report Authenticated By: Tonia Ghent, M.D.    All other review of systems negative or noncontributory except as stated in the HPI  Blood pressure 134/51, pulse 78, temperature 97.3 F (36.3 C), temperature source Oral, resp. rate 13, SpO2 99.00%. General appearance: alert, cooperative and no distress GI: soft, NT, ND, LUQ PEG tube without active bleeding at this time.  I removed a small clot from the area but no evidence of bleeding, he does have some coffee ground type contents in his PEG.  Some melena staining around his rectum but no gross blood  Assessment/Plan: PEG tube bleeding. This appears to be from around the tube and may be due to granulation tissue or possibly from internal source.  GI is planning to evaluate with EGD.  If EGD negative, and no further bleeding at skin, then I would continue to observe for evidence of rebleed.  He appears HD stable at this time.  Ashwini Jago DAVID 12/23/2011, 4:55 AM   EGD demonstrates small pressure ulcer under balloon.  No active bleeding but he did have some clot in his stomach.  No surgery appears to be required at this time but I would agree with admission and continued observation with serial HGB.  His bumper was loosened.

## 2011-12-23 NOTE — Progress Notes (Signed)
Day of Surgery  Subjective: Feels well. No more bowel movements since last night.  No bleeding.  Objective: Vital signs in last 24 hours: Temp:  [97.3 F (36.3 C)-98.7 F (37.1 C)] 98.6 F (37 C) (01/12 1133) Pulse Rate:  [63-92] 79  (01/12 1200) Resp:  [12-22] 17  (01/12 1200) BP: (83-154)/(44-87) 123/56 mmHg (01/12 1200) SpO2:  [97 %-100 %] 99 % (01/12 1200) Weight:  [149 lb 7.6 oz (67.8 kg)] 149 lb 7.6 oz (67.8 kg) (01/12 1133) Last BM Date: 12/22/11  Intake/Output from previous day:   Intake/Output this shift: Total I/O In: 250 [I.V.:250] Out: -   General appearance: alert, cooperative and no distress GI: soft, NT, ND, PEG okay without any evidence of bleeding.    Lab Results:   Basename 12/23/11 0835 12/23/11 0302 12/23/11 0254  WBC 6.3 -- 11.9*  HGB 9.5* 10.9* --  HCT 28.3* 32.0* --  PLT 99* -- 142*   BMET  Basename 12/23/11 0835 12/23/11 0302  NA 144 145  K 4.3 4.8  CL 117* 116*  CO2 23 --  GLUCOSE 89 156*  BUN 52* 56*  CREATININE 0.69 0.90  CALCIUM 7.4* --   PT/INR  Basename 12/23/11 0254  LABPROT 15.1  INR 1.17   ABG No results found for this basename: PHART:2,PCO2:2,PO2:2,HCO3:2 in the last 72 hours  Studies/Results: Dg Chest Port 1 View  12/23/2011  *RADIOLOGY REPORT*  Clinical Data: Bleeding about the PEG tube; assess for free air.  PORTABLE CHEST - 1 VIEW  Comparison: Chest radiograph performed 02/07/2011  Findings: The lungs are well-aerated.  Mild chronic lung changes are noted.  There is no evidence of focal opacification, pleural effusion or pneumothorax.  The cardiomediastinal silhouette is grossly normal in size; prominence of the ascending thoracic aortic silhouette appears grossly stable from the prior study, and may reflect either a prominent ascending aorta or tortuosity of the thoracic aorta.  No acute osseous abnormalities are seen.  No free intra-abdominal air is seen.  IMPRESSION:  1.  No acute cardiopulmonary process seen; stable  prominence or tortuosity of the ascending thoracic aorta. 2.  No free intra-abdominal air seen.  Original Report Authenticated By: Tonia Ghent, M.D.   Dg Abd Portable 1v  12/23/2011  *RADIOLOGY REPORT*  Clinical Data: Bleeding about the PEG tube.  PORTABLE ABDOMEN - 1 VIEW  Comparison: Abdominal radiograph performed 12/16/2004  Findings: The patient's Panda tube is noted overlying the left upper quadrant, in the expected region of the stomach.  However, its location cannot be confirmed by radiograph; if there is significant clinical concern for abnormal positioning, a follow-up radiograph could be performed following injection of a small amount of contrast into the tube.  The visualized bowel gas pattern is grossly unremarkable.  No free intra-abdominal air is seen.  No acute osseous abnormalities are identified.  IMPRESSION: Panda tube noted overlying the left upper quadrant, in the expected region of the stomach.  Original Report Authenticated By: Tonia Ghent, M.D.    Anti-infectives: Anti-infectives    None      Assessment/Plan: s/p Procedure(s): ESOPHAGOGASTRODUODENOSCOPY (EGD) No new recommendations.  No evidence of ongoing bleeding.  Follow clinically and recheck HGB.  Continue PPI.   LOS: 0 days    Lodema Pilot DAVID 12/23/2011

## 2011-12-23 NOTE — Consult Note (Signed)
[facility] Gastroenterology Consultation Note  Referring Provider: Dr. Deanna Artis Primary Care Physician:  Hoyle Sauer, MD, MD Primary Gastroenterologist:  Dr. Dorena Cookey  Reason for Consultation:  Blood per PEG  HPI: Corey [name] is a 78 y.o. male with history of feeding difficulties due to achalasia.  Had surgical PEG tube placed about one year ago, after failed endoscopic and radiologic attempts (due to large distal esophageal diverticulum).  Over the past one year, he apparently has had several tube replacements and upsizing due to either PEG occlusion or leakage around PEG site, last to 24Fr tube a couple weeks ago.  Over the past 24 hours, he has had some episodes of bleeding around, and now through, the PEG tube.  Had a witnessed episode of melena in the ED here, but none before. He feels pretty good right now, specifically denying any abdominal pain, nausea, vomiting.   Takes ASA/day, but no NSAIDs.   Past Medical History  Diagnosis Date  . Aortic insufficiency   . Achalasia, esophageal     s/p PEG tube  . Dysphagia   . Hypertension   . Encephalopathy     Post AAA surgery  . Hyperlipidemia   . Dementia   . Bruises easily   . PVD (peripheral vascular disease)   . AAA (abdominal aortic aneurysm)     Past Surgical History  Procedure Date  . Cardiac catheterization 09/19/2005    NORMAL. EF 65%  . Abdominal aortic aneurysm repair   . Transurethral resection of prostate   . Hernia repair   . Peg placement 2012    Prior to Admission medications   Medication Sig Start Date End Date Taking? Authorizing Provider  aspirin 81 MG tablet Take 81 mg by mouth daily.     Yes Historical Provider, MD  irbesartan (AVAPRO) 150 MG tablet Take 150 mg by mouth at bedtime.   Yes Historical Provider, MD  loratadine (CLARITIN) 10 MG tablet Take 10 mg by mouth daily.   Yes Historical Provider, MD    Current Facility-Administered Medications  Medication Dose Route Frequency Provider  Last Rate Last Dose  . 0.9 %  sodium chloride infusion   Intravenous Once April K Palumbo-Rasch, MD 150 mL/hr at 12/23/11 0302    . 0.9 %  sodium chloride infusion   Intravenous Once April K Palumbo-Rasch, MD      . ondansetron [facility]) 4 MG/2ML injection        8 mg at 12/23/11 0301  . sodium chloride 0.9 % bolus 1,000 mL  1,000 mL Intravenous Once April K Palumbo-Rasch, MD   1,000 mL at 12/23/11 0301  . sodium chloride 0.9 % bolus 1,000 mL  1,000 mL Intravenous Once April K Palumbo-Rasch, MD   1,000 mL at 12/23/11 0330  . sodium chloride 0.9 % bolus 1,000 mL  1,000 mL Intravenous Once April K Palumbo-Rasch, MD   1,000 mL at 12/23/11 0345  . sodium chloride 0.9 % bolus 1,000 mL  1,000 mL Intravenous Once April K Palumbo-Rasch, MD   1,000 mL at 12/23/11 0400  . sodium chloride 0.9 % bolus 1,000 mL  1,000 mL Intravenous Once April K Palumbo-Rasch, MD   1,000 mL at 12/23/11 0350  . DISCONTD: diazepam (VALIUM) injection 5 mg  5 mg Intravenous Once Leanne Chang, NP       Current Outpatient Prescriptions  Medication Sig Dispense Refill  . aspirin 81 MG tablet Take 81 mg by mouth daily.        Marland Kitchen  irbesartan (AVAPRO) 150 MG tablet Take 150 mg by mouth at bedtime.      Marland Kitchen loratadine (CLARITIN) 10 MG tablet Take 10 mg by mouth daily.        Allergies as of 12/23/2011 - Review Complete 12/23/2011  Allergen Reaction Noted  . Iohexol Hives 10/11/2003    Family History  Problem Relation Age of Onset  . Aneurysm Mother   . Heart disease Mother   . Stomach cancer Father   . Cancer Father     colon  . Cancer Brother     lung  . Heart disease Sister     History   Social History  . Marital Status: Married    Spouse Name: N/A    Number of Children: N/A  . Years of Education: N/A   Occupational History  . Not on file.   Social History Main Topics  . Smoking status: Former Smoker    Quit date: 12/11/1982  . Smokeless tobacco: Never Used  . Alcohol Use: No  . Drug Use: No  .  Sexually Active:    Other Topics Concern  . Not on file   Social History Narrative  . No narrative on file    Review of Systems: Positive for: Gen: Denies any fever, chills, rigors, night sweats, anorexia, fatigue, weakness, malaise, involuntary weight loss, and sleep disorder CV: Denies chest pain, angina, palpitations, syncope, orthopnea, PND, peripheral edema, and claudication. Resp: Denies dyspnea, cough, sputum, wheezing, coughing up blood. GI: Described in detail in HPI.    GU : Denies urinary burning, blood in urine, urinary frequency, urinary hesitancy, nocturnal urination, and urinary incontinence. MS: Denies joint pain or swelling.  Denies muscle weakness, cramps, atrophy.  Derm: Denies rash, itching, oral ulcerations, hives, unhealing ulcers.  Psych: Denies depression, anxiety, memory loss, suicidal ideation, hallucinations,  and confusion. Heme: Denies bruising, bleeding, and enlarged lymph nodes. Neuro:  Denies any headaches, dizziness, paresthesias. Endo:  Denies any problems with DM, thyroid, adrenal function.  Physical Exam: Vital signs in last 24 hours: Temp:  [97.3 F (36.3 C)-98.1 F (36.7 C)] 97.3 F (36.3 C) (01/12 0114) Pulse Rate:  [71-92] 72  (01/12 0345) Resp:  [16-22] 18  (01/12 0345) BP: (119-168)/(58-84) 123/58 mmHg (01/12 0353) SpO2:  [100 %] 100 % (01/12 0345) Weight:  [66.407 kg (146 lb 6.4 oz)] 66.407 kg (146 lb 6.4 oz) (01/11 1025)   General:   Alert,  Well-developed, well-nourished, pleasant and cooperative in NAD Head:  Normocephalic and atraumatic. Eyes:  Sclera clear, no icterus.   Conjunctiva pink. Ears:  Normal auditory acuity. Nose:  No deformity, discharge,  or lesions. Mouth:  No deformity or lesions.  Oropharynx pink & moist. Neck:  Supple; no masses or thyromegaly. Lungs:  Clear throughout to auscultation.   No wheezes, crackles, or rhonchi. No acute distress. Heart:  Regular rate and rhythm; no murmurs, clicks, rubs,  or  gallops. Abdomen:  Soft, nontender and nondistended. PEG site seen; there is sizable clot within stoma, which I elected not to disturb.  There is some blood drainage through his PEG tube.  No masses, hepatosplenomegaly or hernias noted. Normal bowel sounds, without guarding, and without rebound.     Msk:  Symmetrical without gross deformities. Normal posture. Pulses:  Normal pulses noted. Extremities:  Without clubbing or edema. Neurologic:  Alert and  oriented x4;  grossly normal neurologically. Skin:  Scattered mild ecchymoses Psych:  Alert and cooperative. Normal mood and affect.   Lab Results:  Alvira Philips  12/23/11 0302 12/23/11 0254  WBC -- 11.9*  HGB 10.9* 11.1*  HCT 32.0* 32.7*  PLT -- 142*   BMET  Basename 12/23/11 0302  NA 145  K 4.8  CL 116*  CO2 --  GLUCOSE 156*  BUN 56*  CREATININE 0.90  CALCIUM --   LFT No results found for this basename: PROT,ALBUMIN,AST,ALT,ALKPHOS,BILITOT,BILIDIR,IBILI in the last 72 hours PT/INR  Basename 12/23/11 0254  LABPROT 15.1  INR 1.17    Studies/Results: Dg Chest Port 1 View  12/23/2011  *RADIOLOGY REPORT*  Clinical Data: Bleeding about the PEG tube; assess for free air.  PORTABLE CHEST - 1 VIEW  Comparison: Chest radiograph performed 02/07/2011  Findings: The lungs are well-aerated.  Mild chronic lung changes are noted.  There is no evidence of focal opacification, pleural effusion or pneumothorax.  The cardiomediastinal silhouette is grossly normal in size; prominence of the ascending thoracic aortic silhouette appears grossly stable from the prior study, and may reflect either a prominent ascending aorta or tortuosity of the thoracic aorta.  No acute osseous abnormalities are seen.  No free intra-abdominal air is seen.  IMPRESSION:  1.  No acute cardiopulmonary process seen; stable prominence or tortuosity of the ascending thoracic aorta. 2.  No free intra-abdominal air seen.  Original Report Authenticated By: Tonia Ghent, M.D.    Dg Abd Portable 1v  12/23/2011  *RADIOLOGY REPORT*  Clinical Data: Bleeding about the PEG tube.  PORTABLE ABDOMEN - 1 VIEW  Comparison: Abdominal radiograph performed 12/16/2004  Findings: The patient's Panda tube is noted overlying the left upper quadrant, in the expected region of the stomach.  However, its location cannot be confirmed by radiograph; if there is significant clinical concern for abnormal positioning, a follow-up radiograph could be performed following injection of a small amount of contrast into the tube.  The visualized bowel gas pattern is grossly unremarkable.  No free intra-abdominal air is seen.  No acute osseous abnormalities are identified.  IMPRESSION: Panda tube noted overlying the left upper quadrant, in the expected region of the stomach.  Original Report Authenticated By: Tonia Ghent, M.D.    Impression:  1.  Bleeding through/around PEG tube.  Recent PEG tube upsizing.  Suspect this is either internal bumper-related ulceration or irritation around stoma and abdominal wall.  Patient hemodynamically stable and not ill-appearing at this time.  Plan:  1.  Medical management (IVF, labs, etc.). 2.  Endoscopy now in emergency department for further evaluation. 3.  Surgery is seeing patient in consultation as well. 4.  Risks (bleeding, infection, bowel perforation that could require surgery, sedation-related changes in cardiopulmonary systems), benefits (identification and possible treatment of source of symptoms, exclusion of certain causes of symptoms), and alternatives (watchful waiting, radiographic imaging studies, empiric medical treatment) of upper endoscopy (EGD) were explained to patient + wife in detail and patient wishes to proceed.   LOS: 0 days   Yina Riviere M  12/23/2011, 4:40 AM

## 2011-12-23 NOTE — H&P (View-Only) (Signed)
Chief complaint: Bleeding from PEG site  History of present illness: This patient had a PEG tube placed last year by Dr. Derrell Lolling. It was recently upsized by the radiologist. The patient was in the gastroenterology office today because of some issues with the skin around the site. He then began bleeding bright red blood around the tube. There was no bleeding through the tube. He was sent directly to our office to be seen urgently.  Past history family history and review of systems have been noted but not read dictated into this note.  Exam: BP 168/84  Pulse 88  Temp(Src) 98.1 F (36.7 C) (Temporal)  Resp 20  Ht 5\' 9"  (1.753 m)  Wt 146 lb 6.4 oz (66.407 kg)  BMI 21.62 kg/m2  General: The patient is thin but generally healthy and in no distress.  Abdomen: There is a 24 French PEG tube in the left upper quadrant. The abdomen is soft and benign. All incisions are well-healed. There is blood which appears to be bright red oozing from around the tube. There are no granulations noted on the edge of the skin and the skin is all intact. There doesn't appear to be any blood in the tube itself.  Impression: Bleeding, apparently from the abdominal wall peg site without source clearly seen.  Plan: I was able to put upward traction on the tube such that the balloon compressed up against the anterior abdominal wall and stomach wall. While doing that there is no bleeding but as soon as the compression was released bleeding began again. There was no visible vessel. I therefore held pressure for about 5 minutes with the compression and there is no bleeding while doing that. I also put some topical hemostat down around the edges of the tube going into the tract of the tube. I then held pressure additional 5 minutes. At this point there is no continuing or active bleeding.  I used the silicone ring to keep the balloon tight up against the anterior abdominal wall wall.We kept the patient for an additional 20  minutes during which time there was no bleeding whatsoever. We then irrigated the feeding tube and everything was clear and there was no blood that had accumulated within the  Stomach.  I asked the patient to stop using aspirin for approximately 3 days. They are not going to do any tube feedings until late this afternoon. If they have any further bleeding asthey had earlier today when he came in they will go to the emergency room as we will need to have more Equipment to do a better exam and evaluation. I think all questions were answered.

## 2011-12-23 NOTE — ED Provider Notes (Addendum)
History     CSN: [id number]  Arrival date & time 12/23/11  0110   First MD Initiated Contact with Patient 12/23/11 0118      Chief Complaint  Patient presents with  . Wound Check    (Consider location/radiation/quality/duration/timing/severity/associated sxs/prior treatment) Patient is a 78 y.o. male presenting with hematochezia and wound check. The history is provided by the patient and the spouse. No language interpreter was used.  Rectal Bleeding  The current episode started today. The onset was sudden. The problem occurs continuously. The problem has been unchanged. The patient is experiencing no pain. Pertinent negatives include no diarrhea and no vomiting.  Wound Check  Treated in ED: Not prior to today was seen by Dr. Madilyn Fireman of GI and [facility] of surgery due to bleeding at peg site. His temperature was unmeasured prior to arrival. There has been bloody discharge from the wound. There is no redness present. There is no swelling present.  Patient dropped BP in the ED and was then noted to have hematemesis like material and PEG conduit.  On rectal melena and red blood noted in undergarments and melena at anal sphincter  Past Medical History  Diagnosis Date  . Aortic insufficiency   . Achalasia, esophageal     s/p PEG tube  . Dysphagia   . Hypertension   . Encephalopathy     Post AAA surgery  . Hyperlipidemia   . Dementia   . Bruises easily   . PVD (peripheral vascular disease)   . AAA (abdominal aortic aneurysm)     Past Surgical History  Procedure Date  . Cardiac catheterization 09/19/2005    NORMAL. EF 65%  . Abdominal aortic aneurysm repair   . Transurethral resection of prostate   . Hernia repair   . Peg placement 2012    Family History  Problem Relation Age of Onset  . Aneurysm Mother   . Heart disease Mother   . Stomach cancer Father   . Cancer Father     colon  . Cancer Brother     lung  . Heart disease Sister     History  Substance Use Topics  .  Smoking status: Former Smoker    Quit date: 12/11/1982  . Smokeless tobacco: Never Used  . Alcohol Use: No      Review of Systems  Constitutional: Negative.   HENT: Negative.   Eyes: Negative.   Respiratory: Negative for apnea.   Cardiovascular: Negative.   Gastrointestinal: Positive for blood in stool and hematochezia. Negative for vomiting, diarrhea and abdominal distention.  Genitourinary: Negative.   Musculoskeletal: Negative.   Skin: Negative.   Neurological: Negative.   Hematological: Negative.   Psychiatric/Behavioral: Negative.     Allergies  Iohexol  Home Medications   Current Outpatient Rx  Name Route Sig Dispense Refill  . ASPIRIN 81 MG PO TABS Oral Take 81 mg by mouth daily.      . IRBESARTAN 150 MG PO TABS Oral Take 150 mg by mouth at bedtime.    Marland Kitchen LORATADINE 10 MG PO TABS Oral Take 10 mg by mouth daily.      BP 134/51  Pulse 78  Temp(Src) 97.3 F (36.3 C) (Oral)  Resp 13  SpO2 100%  Physical Exam  Constitutional: No distress.  HENT:  Head: Normocephalic and atraumatic.  Eyes: Pupils are equal, round, and reactive to light.       pale  Neck: Normal range of motion. Neck supple.  Cardiovascular: Normal rate and regular  rhythm.   Pulmonary/Chest: Effort normal and breath sounds normal. He has no wheezes.  Abdominal: Soft. Bowel sounds are normal. He exhibits no distension. There is no rebound and no guarding.       Blood at peg insertion and hematemesis in tube  Musculoskeletal: Normal range of motion. He exhibits no edema.  Skin: Skin is warm and dry. No pallor.  Psychiatric: Thought content normal.    ED Course  Procedures (including critical care time)  Labs Reviewed  CBC - Abnormal; Notable for the following:    WBC 11.9 (*)    RBC 3.50 (*)    Hemoglobin 11.1 (*)    HCT 32.7 (*)    Platelets 142 (*)    All other components within normal limits  DIFFERENTIAL - Abnormal; Notable for the following:    Neutro Abs 8.9 (*)    All other  components within normal limits  POCT I-STAT, CHEM 8 - Abnormal; Notable for the following:    Chloride 116 (*)    BUN 56 (*)    Glucose, Bld 156 (*)    Hemoglobin 10.9 (*)    HCT 32.0 (*)    All other components within normal limits  TYPE AND SCREEN  PROTIME-INR  LIPASE, BLOOD  OCCULT BLOOD, POC DEVICE  LACTIC ACID, PLASMA  I-STAT, CHEM 8  ABO/RH  PREPARE PLATELET PHERESIS   Dg Chest Port 1 View  12/23/2011  *RADIOLOGY REPORT*  Clinical Data: Bleeding about the PEG tube; assess for free air.  PORTABLE CHEST - 1 VIEW  Comparison: Chest radiograph performed 02/07/2011  Findings: The lungs are well-aerated.  Mild chronic lung changes are noted.  There is no evidence of focal opacification, pleural effusion or pneumothorax.  The cardiomediastinal silhouette is grossly normal in size; prominence of the ascending thoracic aortic silhouette appears grossly stable from the prior study, and may reflect either a prominent ascending aorta or tortuosity of the thoracic aorta.  No acute osseous abnormalities are seen.  No free intra-abdominal air is seen.  IMPRESSION:  1.  No acute cardiopulmonary process seen; stable prominence or tortuosity of the ascending thoracic aorta. 2.  No free intra-abdominal air seen.  Original Report Authenticated By: Tonia Ghent, M.D.   Dg Abd Portable 1v  12/23/2011  *RADIOLOGY REPORT*  Clinical Data: Bleeding about the PEG tube.  PORTABLE ABDOMEN - 1 VIEW  Comparison: Abdominal radiograph performed 12/16/2004  Findings: The patient's Panda tube is noted overlying the left upper quadrant, in the expected region of the stomach.  However, its location cannot be confirmed by radiograph; if there is significant clinical concern for abnormal positioning, a follow-up radiograph could be performed following injection of a small amount of contrast into the tube.  The visualized bowel gas pattern is grossly unremarkable.  No free intra-abdominal air is seen.  No acute osseous  abnormalities are identified.  IMPRESSION: Panda tube noted overlying the left upper quadrant, in the expected region of the stomach.  Original Report Authenticated By: Tonia Ghent, M.D.     1. GI bleeding   2. PEG tube malfunction       MDM  Case d/w Dr. Dulce Sellar who will see patient but not admit.  Case d/w Dr. Timothy Lasso who will admit patient.    MDM Reviewed: nursing note and vitals Interpretation: ECG, x-ray and labs Consults: admitting MD and gastrointestinal    CRITICAL CARE Performed by: Jasmine Awe   Total critical care time: 60 minutes  Critical care time was exclusive of  separately billable procedures and treating other patients.  Critical care was necessary to treat or prevent imminent or life-threatening deterioration.  Critical care was time spent personally by me on the following activities: development of treatment plan with patient and/or surrogate as well as nursing, discussions with consultants, evaluation of patient's response to treatment, examination of patient, obtaining history from patient or surrogate, ordering and performing treatments and interventions, ordering and review of laboratory studies, ordering and review of radiographic studies, pulse oximetry and re-evaluation of patient's condition.    Date: 12/23/2011  Rate: 74  Rhythm: normal sinus rhythm  QRS Axis: left  Intervals: PR prolonged  ST/T Wave abnormalities: nonspecific ST changes  Conduction Disutrbances:first-degree A-V block   Narrative Interpretation:   Old EKG Reviewed: none available    Giankarlo Leamer K Alara Daniel-Rasch, MD 12/23/11 [id number]  Shannette Tabares K Latoria Dry-Rasch, MD 12/23/11 [phone]

## 2011-12-24 LAB — CBC
HCT: 25.6 % — ABNORMAL LOW (ref 39.0–52.0)
HCT: 25.6 % — ABNORMAL LOW (ref 39.0–52.0)
Hemoglobin: 8.7 g/dL — ABNORMAL LOW (ref 13.0–17.0)
MCH: 32 pg (ref 26.0–34.0)
MCHC: 34 g/dL (ref 30.0–36.0)
MCHC: 34 g/dL (ref 30.0–36.0)
Platelets: 79 10*3/uL — ABNORMAL LOW (ref 150–400)
RDW: 14.5 % (ref 11.5–15.5)
RDW: 14.3 % (ref 11.5–15.5)
WBC: 4.7 10*3/uL (ref 4.0–10.5)

## 2011-12-24 LAB — COMPREHENSIVE METABOLIC PANEL
ALT: 15 U/L (ref 0–53)
Alkaline Phosphatase: 77 U/L (ref 39–117)
BUN: 33 mg/dL — ABNORMAL HIGH (ref 6–23)
CO2: 21 mEq/L (ref 19–32)
Chloride: 117 mEq/L — ABNORMAL HIGH (ref 96–112)
GFR calc Af Amer: >90 mL/min (ref >90)
GFR calc non Af Amer: 81 mL/min — ABNORMAL LOW (ref >90)
Glucose, Bld: 82 mg/dL (ref 70–99)
Potassium: 3.9 mEq/L (ref 3.5–5.1)
Sodium: 144 mEq/L (ref 135–145)
Total Bilirubin: 0.7 mg/dL (ref 0.3–1.2)

## 2011-12-24 MED ORDER — DIPHENHYDRAMINE HCL 50 MG/ML IJ SOLN
12.5000 mg | Freq: Once | INTRAMUSCULAR | Status: AC
  Administered 2011-12-24: 12.5 mg via INTRAVENOUS

## 2011-12-24 MED ORDER — LORATADINE 5 MG/5ML PO SYRP
10.0000 mg | ORAL_SOLUTION | Freq: Every day | ORAL | Status: DC | PRN
  Filled 2011-12-24: qty 10

## 2011-12-24 MED ORDER — MORPHINE SULFATE 2 MG/ML IJ SOLN
2.0000 mg | INTRAMUSCULAR | Status: DC | PRN
  Administered 2011-12-24: 2 mg via INTRAVENOUS
  Filled 2011-12-24: qty 1

## 2011-12-24 MED ORDER — PANTOPRAZOLE SODIUM 40 MG PO TBEC: 40.0000 mg | DELAYED_RELEASE_TABLET | Freq: Every day | ORAL | Status: DC

## 2011-12-24 MED ORDER — PANTOPRAZOLE SODIUM 40 MG PO PACK
40.0000 mg | PACK | Freq: Every day | ORAL | Status: DC
  Administered 2011-12-24 – 2011-12-25 (×2): 40 mg
  Filled 2011-12-24 (×6): qty 20

## 2011-12-24 MED ORDER — DIPHENHYDRAMINE HCL 50 MG/ML IJ SOLN
INTRAMUSCULAR | Status: AC
  Administered 2011-12-24: 12.5 mg via INTRAVENOUS
  Filled 2011-12-24: qty 1

## 2011-12-24 NOTE — Progress Notes (Signed)
1 Day Post-Op  Subjective: No issues.  No BM, no pain, or evidence of bleeding.  Objective: Vital signs in last 24 hours: Temp:  [97.5 F (36.4 C)-98.7 F (37.1 C)] 97.5 F (36.4 C) (01/13 0400) Pulse Rate:  [66-89] 78  (01/13 0700) Resp:  [14-20] 14  (01/13 0700) BP: (122-154)/(48-68) 139/58 mmHg (01/13 0700) SpO2:  [92 %-100 %] 97 % (01/13 0700) Weight:  [149 lb 7.6 oz (67.8 kg)-151 lb 7.3 oz (68.7 kg)] 151 lb 7.3 oz (68.7 kg) (01/13 0000) Last BM Date: 12/22/11  Intake/Output from previous day: 01/12 0701 - 01/13 0700 In: 2505 [I.V.:1975; IV Piggyback:500] Out: 1150 [Urine:1150] Intake/Output this shift:    General appearance: alert, cooperative and no distress GI: soft, non-tender; bowel sounds normal; no masses,  no organomegaly, no evidence of bleeding around the tube.  Lab Results:   Basename 12/24/11 0321 12/23/11 1639 12/23/11 0835  WBC 4.7 -- 6.3  HGB 8.7* 9.6* --  HCT 25.6* 28.4* --  PLT 79* -- 99*   BMET  Basename 12/24/11 0321 12/23/11 0835  NA 144 144  K 3.9 4.3  CL 117* 117*  CO2 21 23  GLUCOSE 82 89  BUN 33* 52*  CREATININE 0.82 0.69  CALCIUM 7.8* 7.4*   PT/INR  Basename 12/23/11 0254  LABPROT 15.1  INR 1.17   ABG No results found for this basename: PHART:2,PCO2:2,PO2:2,HCO3:2 in the last 72 hours  Studies/Results: Dg Chest Port 1 View  12/23/2011  *RADIOLOGY REPORT*  Clinical Data: Bleeding about the PEG tube; assess for free air.  PORTABLE CHEST - 1 VIEW  Comparison: Chest radiograph performed 02/07/2011  Findings: The lungs are well-aerated.  Mild chronic lung changes are noted.  There is no evidence of focal opacification, pleural effusion or pneumothorax.  The cardiomediastinal silhouette is grossly normal in size; prominence of the ascending thoracic aortic silhouette appears grossly stable from the prior study, and may reflect either a prominent ascending aorta or tortuosity of the thoracic aorta.  No acute osseous abnormalities are  seen.  No free intra-abdominal air is seen.  IMPRESSION:  1.  No acute cardiopulmonary process seen; stable prominence or tortuosity of the ascending thoracic aorta. 2.  No free intra-abdominal air seen.  Original Report Authenticated By: Tonia Ghent, M.D.   Dg Abd Portable 1v  12/23/2011  *RADIOLOGY REPORT*  Clinical Data: Bleeding about the PEG tube.  PORTABLE ABDOMEN - 1 VIEW  Comparison: Abdominal radiograph performed 12/16/2004  Findings: The patient's Panda tube is noted overlying the left upper quadrant, in the expected region of the stomach.  However, its location cannot be confirmed by radiograph; if there is significant clinical concern for abnormal positioning, a follow-up radiograph could be performed following injection of a small amount of contrast into the tube.  The visualized bowel gas pattern is grossly unremarkable.  No free intra-abdominal air is seen.  No acute osseous abnormalities are identified.  IMPRESSION: Panda tube noted overlying the left upper quadrant, in the expected region of the stomach.  Original Report Authenticated By: Tonia Ghent, M.D.    Anti-infectives: Anti-infectives    None      Assessment/Plan: s/p Procedure(s): ESOPHAGOGASTRODUODENOSCOPY (EGD) feels well, no sign of bleeding, HGB okay.  I think that it is reasonable to restart feeding and plan for home tomorrow if no evidence of rebleeding, and HGB stable.  LOS: 1 day    Corey [name] Corey [name] 12/24/2011

## 2011-12-24 NOTE — Progress Notes (Signed)
Subjective:  No further blood in stool; no blood around PEG.  Objective: Vital signs in last 24 hours: Temp:  [97.5 F (36.4 C)-98.6 F (37 C)] 97.5 F (36.4 C) (01/13 0800) Pulse Rate:  [72-89] 83  (01/13 0800) Resp:  [14-20] 17  (01/13 0800) BP: (122-163)/(48-68) 163/67 mmHg (01/13 0800) SpO2:  [92 %-100 %] 96 % (01/13 0800) Weight:  [67.8 kg (149 lb 7.6 oz)-68.7 kg (151 lb 7.3 oz)] 68.7 kg (151 lb 7.3 oz) (01/13 0000) Weight change:  Last BM Date: 12/22/11  PE: GEN:  NAD, pleasant ABD:  PEG tube CDI; no blood through or around PEG tube; abdomen soft  Lab Results: CBC    Component Value Date/Time   WBC 4.7 12/24/2011 0321   RBC 2.70* 12/24/2011 0321   HGB 8.7* 12/24/2011 0321   HCT 25.6* 12/24/2011 0321   PLT 79* 12/24/2011 0321   MCV 94.8 12/24/2011 0321   MCH 32.2 12/24/2011 0321   MCHC 34.0 12/24/2011 0321   RDW 14.5 12/24/2011 0321   LYMPHSABS 2.0 12/23/2011 0254   MONOABS 0.9 12/23/2011 0254   EOSABS 0.1 12/23/2011 0254   BASOSABS 0.0 12/23/2011 0254      Assessment:  1.  Blood in stool.  Likely from ischemic ulceration around internal balloon bumper, as result of internal bumper being too tight. 2.  Acute blood loss anemia.  Resolved.  Plan:  1.  Start PEG feeds. 2.  PPI per PEG for the next 8 weeks or so; if rebleeds, would likely need indefinite PPI. 3.  Make sure internal balloon is not too tight; based on our endoscopic measurements, would not tighten external (silicone) bumper to measurement any less than 3cm. 4.  Will sign off; please call with any questions.   [name] 12/24/2011, 11:09 AM

## 2011-12-24 NOTE — Progress Notes (Signed)
Subjective: Feeling well. No More Bleeding No BRBPR or melena. Hbg trended down some. BPs are fine  Objective: Vital signs in last 24 hours: Temp:  [97.5 F (36.4 C)-98.7 F (37.1 C)] 97.5 F (36.4 C) (01/13 0400) Pulse Rate:  [66-89] 72  (01/13 0500) Resp:  [14-20] 14  (01/13 0500) BP: (122-154)/(48-68) 122/60 mmHg (01/13 0500) SpO2:  [92 %-100 %] 92 % (01/13 0500) Weight:  [67.8 kg (149 lb 7.6 oz)-68.7 kg (151 lb 7.3 oz)] 68.7 kg (151 lb 7.3 oz) (01/13 0000) Weight change:  Last BM Date: 12/22/11  CBG (last 3)  No results found for this basename: GLUCAP:3 in the last 72 hours  Intake/Output from previous day:  Intake/Output Summary (Last 24 hours) at 12/24/11 0741 Last data filed at 12/24/11 0700  Gross per 24 hour  Intake   2505 ml  Output   1150 ml  Net   1355 ml   01/12 0701 - 01/13 0700 In: 2505 [I.V.:1975; IV Piggyback:500] Out: 1150 [Urine:1150]   Physical Exam General appearance: A+O Throat: oropharynx moist without erythema Resp: CTA B Cardio: Reg GI: soft, non-tender; bowel sounds normal; no masses,  no organomegaly.  PEG CDI Extremities: no clubbing, cyanosis or edema   Lab Results:  [facility] 12/24/11 0321 12/23/11 0835  NA 144 144  K 3.9 4.3  CL 117* 117*  CO2 21 23  GLUCOSE 82 89  BUN 33* 52*  CREATININE 0.82 0.69  CALCIUM 7.8* 7.4*  MG -- --  PHOS -- --     Basename 12/24/11 0321 12/23/11 0835  AST 23 17  ALT 15 15  ALKPHOS 77 86  BILITOT 0.7 0.6  PROT 5.0* 4.9*  ALBUMIN 2.9* 2.8*     Basename 12/24/11 0321 12/23/11 1639 12/23/11 0835 12/23/11 0254  WBC 4.7 -- 6.3 --  NEUTROABS -- -- -- 8.9*  HGB 8.7* 9.6* -- --  HCT 25.6* 28.4* -- --  MCV 94.8 -- 94.6 --  PLT 79* -- 99* --    Lab Results  Component Value Date   INR 1.17 12/23/2011   INR 1.16 02/07/2011   INR 1.0 12/24/2007    No results found for this basename: CKTOTAL:3,CKMB:3,CKMBINDEX:3,TROPONINI:3 in the last 72 hours  No results found for this basename:  TSH,T4TOTAL,FREET3,T3FREE,THYROIDAB in the last 72 hours  No results found for this basename: VITAMINB12:2,FOLATE:2,FERRITIN:2,TIBC:2,IRON:2,RETICCTPCT:2 in the last 72 hours  Micro Results: Recent Results (from the past 240 hour(s))  MRSA PCR SCREENING     Status: Normal   Collection Time   12/23/11 11:16 AM      Component Value Range Status Comment   MRSA by PCR NEGATIVE  NEGATIVE  Final      Studies/Results: Dg Chest Port 1 View  12/23/2011  *RADIOLOGY REPORT*  Clinical Data: Bleeding about the PEG tube; assess for free air.  PORTABLE CHEST - 1 VIEW  Comparison: Chest radiograph performed 02/07/2011  Findings: The lungs are well-aerated.  Mild chronic lung changes are noted.  There is no evidence of focal opacification, pleural effusion or pneumothorax.  The cardiomediastinal silhouette is grossly normal in size; prominence of the ascending thoracic aortic silhouette appears grossly stable from the prior study, and may reflect either a prominent ascending aorta or tortuosity of the thoracic aorta.  No acute osseous abnormalities are seen.  No free intra-abdominal air is seen.  IMPRESSION:  1.  No acute cardiopulmonary process seen; stable prominence or tortuosity of the ascending thoracic aorta. 2.  No free intra-abdominal air seen.  Original Report Authenticated By: Tonia Ghent, M.D.   Dg Abd Portable 1v  12/23/2011  *RADIOLOGY REPORT*  Clinical Data: Bleeding about the PEG tube.  PORTABLE ABDOMEN - 1 VIEW  Comparison: Abdominal radiograph performed 12/16/2004  Findings: The patient's Panda tube is noted overlying the left upper quadrant, in the expected region of the stomach.  However, its location cannot be confirmed by radiograph; if there is significant clinical concern for abnormal positioning, a follow-up radiograph could be performed following injection of a small amount of contrast into the tube.  The visualized bowel gas pattern is grossly unremarkable.  No free intra-abdominal air  is seen.  No acute osseous abnormalities are identified.  IMPRESSION: Panda tube noted overlying the left upper quadrant, in the expected region of the stomach.  Original Report Authenticated By: Tonia Ghent, M.D.     Medications: Scheduled:   . diphenhydrAMINE  12.5 mg Intravenous Once  . pantoprazole (PROTONIX) IV  80 mg Intravenous Once  . sodium chloride  500 mL Intravenous Once   Continuous:   . sodium chloride 1,000 mL (12/24/11 0105)  . pantoprozole (PROTONIX) infusion 8 mg/hr (12/24/11 0700)     Assessment/Plan: Principal Problem:  *Upper GI bleed Active Problems:  HTN (hypertension)  PEG tube malfunction  Anemia  Hypotension  Hypotension -  Improved.  May start BP tomorrow. Hbg trending down.  No evidence active bleeding. Protonix changed to PO.  He already has a current T and S  Re-feed per peg and clears by mouth if he can tolerate. Hold ASA for at least 2 weeks Restart Avapro maybe tomorrow? OK for Claritin OK to walk ? D/C tom?  DVT Prophylaxis - Squeezers    LOS: 1 day   Sequoia Witz M 12/24/2011, 7:41 AM

## 2011-12-24 NOTE — ED Provider Notes (Signed)
Medical screening examination/treatment/procedure(s) were performed by non-physician practitioner and as supervising physician I was immediately available for consultation/collaboration.  Jasmine Awe, MD 12/24/11 [phone]

## 2011-12-24 NOTE — Progress Notes (Signed)
Report called to [facility] , Charity fundraiser.  Patient will transfer by wheelchair.  Will continue to monitor.

## 2011-12-25 ENCOUNTER — Encounter (HOSPITAL_COMMUNITY): Payer: Self-pay | Admitting: Gastroenterology

## 2011-12-25 LAB — COMPREHENSIVE METABOLIC PANEL
AST: 28 U/L (ref 0–37)
Albumin: 3 g/dL — ABNORMAL LOW (ref 3.5–5.2)
Alkaline Phosphatase: 87 U/L (ref 39–117)
BUN: 27 mg/dL — ABNORMAL HIGH (ref 6–23)
Chloride: 112 mEq/L (ref 96–112)
Potassium: 3.6 mEq/L (ref 3.5–5.1)
Total Bilirubin: 0.5 mg/dL (ref 0.3–1.2)
Total Protein: 5.3 g/dL — ABNORMAL LOW (ref 6.0–8.3)

## 2011-12-25 LAB — DIFFERENTIAL
Basophils Relative: 0 % (ref 0–1)
Eosinophils Relative: 0 % (ref 0–5)
Monocytes Absolute: 0.5 10*3/uL (ref 0.1–1.0)
Monocytes Relative: 10 % (ref 3–12)
Neutro Abs: 3.5 10*3/uL (ref 1.7–7.7)

## 2011-12-25 LAB — CBC
HCT: 24.2 % — ABNORMAL LOW (ref 39.0–52.0)
HCT: 25 % — ABNORMAL LOW (ref 39.0–52.0)
Hemoglobin: 8.7 g/dL — ABNORMAL LOW (ref 13.0–17.0)
MCH: 32 pg (ref 26.0–34.0)
MCH: 32.3 pg (ref 26.0–34.0)
MCHC: 34.8 g/dL (ref 30.0–36.0)
MCV: 93.4 fL (ref 78.0–100.0)
MCV: 92.9 fL (ref 78.0–100.0)
Platelets: 89 10*3/uL — ABNORMAL LOW (ref 150–400)
RBC: 2.59 MIL/uL — ABNORMAL LOW (ref 4.22–5.81)
RDW: 14.1 % (ref 11.5–15.5)
WBC: 4.7 10*3/uL (ref 4.0–10.5)

## 2011-12-25 MED ORDER — PANTOPRAZOLE SODIUM 40 MG PO PACK: 40.0000 mg | PACK | Freq: Every day | ORAL | Status: DC

## 2011-12-25 MED ORDER — SODIUM CHLORIDE 0.9 % IV SOLN: INTRAVENOUS | Status: DC

## 2011-12-25 MED ORDER — TWOCAL HN PO LIQD: 5.0000 | Freq: Four times a day (QID) | ORAL | Status: DC

## 2011-12-25 MED ORDER — ASPIRIN 81 MG PO TABS: 81.0000 mg | ORAL_TABLET | Freq: Every day | ORAL | Status: DC

## 2011-12-25 MED ORDER — TWOCAL HN PO LIQD
5.0000 | Freq: Four times a day (QID) | ORAL | Status: DC
  Administered 2011-12-25 (×3): 1

## 2011-12-25 MED ORDER — LORATADINE 5 MG/5ML PO SYRP: 10.0000 mg | ORAL_SOLUTION | Freq: Every day | ORAL | Status: DC | PRN

## 2011-12-25 NOTE — Progress Notes (Signed)
DR. Felipa Eth D/C PT. HOME, PT. STATES HE DOES WANT TO GO HOME. AND PT. STATES THEY WILL CALL THE GI MD. IF THERE ARE ANY PROBLEMS.  DR. Felipa Eth IS ALSO AWARE OF PT. HAVING A DARK STOOL. AND STATED FOR THEM TO CALL GI. FOR ANY PROBLEMS.

## 2011-12-25 NOTE — Progress Notes (Signed)
S the patient was going to be discharged today, but began to pass melena. O VSS no ditress     Heart RRR     PEG tube drainage is yellow, no blood     Repeat HGB 8.7 which is stable. A I believe the melena was old blood from the prior bleed. Clinically there is no evidence of active bleeding. P Check repeat CBC in morning if stable DC home.

## 2011-12-25 NOTE — Progress Notes (Signed)
ALSO NOTIFIED DR. Evlyn Kanner ABOUT PT. 2 DARK STOOLS HE SAID IT IS OK TO DISCHARGE HOME TO HVE PT. COME IN WED. OR THURS. THIS WEEK FOR A HGB BLOOD TEST. WILL D/C PT. HOME.

## 2011-12-25 NOTE — Progress Notes (Signed)
Nutrition Brief Note:  Pt admitted with rectal bleeding. Pt states he has had dark stools for several months, diarrhea is new to him.   Pt has a PEG due to achalasia.  He boluses 5 cans of 2Cal daily for a total of 2370 kcal, 98g protein, 829 mL free water.  Discussed with pt and wife.  Wife is in frequent contact with home health RD for TF management.   Wife and pt state they are comfortable with TF management, and do not need information or changes at this time.   Noted pt to d/c today.  Pager: [phone]

## 2011-12-25 NOTE — Progress Notes (Signed)
DISCHARGE SUMMARY  BARRY FAIRCLOTH  MR#: [id number]  DOB:12-03-1929  Date of Admission: 12/23/2011 Date of Discharge: 12/25/2011  Attending Physician:Jye Fariss R  Patient's WUJ:WJXB,JYNWGNFAOZ R, MD, MD  Consults: Dr. Dulce Sellar; [facility]CS  Discharge Diagnoses: Principal Problem:  *Upper GI bleed Active Problems:  HTN (hypertension)  PEG tube malfunction  Anemia  Hypotension   Discharge Medications: Current Discharge Medication List    START taking these medications   Details  loratadine (CLARITIN) 5 MG/5ML syrup Place 10 mLs (10 mg total) into feeding tube daily as needed. Qty: 120 mL, Refills: 12    Nutritional Supplements (TWOCAL HN) LIQD Place 1,185 mLs (5 Cans total) into feeding tube 4 (four) times daily. Qty: 120 Can, Refills: 12    pantoprazole sodium (PROTONIX) 40 mg/20 mL PACK Place 20 mLs (40 mg total) into feeding tube daily at 12 noon. Qty: 30 each, Refills: 2      CONTINUE these medications which have CHANGED   Details  aspirin 81 MG tablet-2 weeks after discharge  Take 1 tablet (81 mg total) by mouth daily. Qty: 30 tablet, Refills: 12      CONTINUE these medications which have NOT CHANGED   Details  irbesartan (AVAPRO) 150 MG tablet Take 150 mg by mouth at bedtime.    loratadine (CLARITIN) 10 MG tablet Take 10 mg by mouth daily.        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 It was recently upsized by IR. He was seen by GI and CCS (Dr Jamey Ripa) as an outpt on 12/22/11 b/c of issues with bleeding/BRB oozing @ the PEG site. Dr Jamey Ripa used the balloon to tamponade what he thought was an ab wall bleeder. He followed that up with topical hemostat and silicone ring to continue the tamponade. He was sent home with plans to hold ASA for 3 days and hold TFs for a few hrs. Later he developed increased bleeding @ the PEG and called [facility] and sent to ED. He was evaluated  by ED MD, Dr Dulce Sellar and CCS. During Eval he had a witnessed episode of melena - none prior. He denied any abdominal pain, nausea, vomiting. He takes ASA/day, but no NSAIDs. B/C of the Bleeding and Melena he was endoscoped and found small pressure ulcer under balloon. No active bleeding but he did have some clot in his stomach. No surgery is required at this time but surgery has recommended admission and continued observation with serial HGB. His bumper was loosened. Both GI and CCS felt that due to the complexities of his med issues that they would want him on the Internal medicine Service. As he awoke from procedure and while anesthesia wore off he dropped his BPs into 70's and required IVF boluses. Blood is on order but has not needed to be given. He is currently comfortable and HD stable  Hospital Course: Patient's blood pressure stabilized, patient remained off of his antihypertensive medication, was given a significant amount of IV fluids, no evidence of pulmonary edema but did have some lower extremity edema. Remained on proton pump inhibitor, feeds were reinitiated on January 13, tolerated relatively well however did have some issues with nausea at first. No active vomiting. Blood in the stools likely secondary to ischemic ulceration around the internal bumper balloon, as a result of the internal bumper being too tight. Both gastroenterology as well as general surgery continued  to follow the patient, recommended reinitiating PEG tube feedings, proton pump inhibitor from the PEG tube for approximately 8 weeks and if he rebleeds we'll probably need proton pump inhibitor indefinitely. On the day of discharge, patient's wife and patient had multiple questions with respect to the PEG tube, gastroenterology had signed off, advised them to followup with gastroenterology, Dr. Madilyn Fireman for colleague for further management. Did have one dark stool however patient's wife stated that this may have been looser than  typical but same color of his typical and normal stools. Patient did have some nausea after second tube feeds however this quickly resolved without intervention. They were anxious for discharge and were in  agreement with outpatient management.  Day of Discharge Exam BP 155/65  Pulse 71  Temp(Src) 97.8 F (36.6 C) (Oral)  Resp 18  Ht 5\' 9"  (1.753 m)  Wt 68.765 kg (151 lb 9.6 oz)  BMI 22.39 kg/m2  SpO2 95%  Physical Exam: General appearance: alert, cooperative and no distress Eyes: no scleral icterus Throat: oropharynx moist without erythema Resp: clear to auscultation bilaterally Cardio: regular rate and rhythm Dementia-soft, nontender, bowel sounds present, PEG tube clean dry and intact Extremities: no clubbing, cyanosis trace to 1+ edema  Discharge Labs:  [facility] 12/25/11 0448 12/24/11 0321  NA 144 144  K 3.6 3.9  CL 112 117*  CO2 25 21  GLUCOSE 94 82  BUN 27* 33*  CREATININE 0.74 0.82  CALCIUM 8.5 7.8*  MG -- --  PHOS -- --    Basename 12/25/11 0448 12/24/11 0321  AST 28 23  ALT 17 15  ALKPHOS 87 77  BILITOT 0.5 0.7  PROT 5.3* 5.0*  ALBUMIN 3.0* 2.9*    Basename 12/25/11 0448 12/24/11 2147 12/23/11 0254  WBC 4.7 5.1 --  NEUTROABS -- -- 8.9*  HGB 8.3* 8.7* --  HCT 24.2* 25.6* --  MCV 93.4 94.1 --  PLT 89* 97* --   No results found for this basename: CKTOTAL:3,CKMB:3,CKMBINDEX:3,TROPONINI:3 in the last 72 hours No results found for this basename: TSH,T4TOTAL,FREET3,T3FREE,THYROIDAB in the last 72 hours No results found for this basename: VITAMINB12:2,FOLATE:2,FERRITIN:2,TIBC:2,IRON:2,RETICCTPCT:2 in the last 72 hours  Discharge instructions: Discharge Orders    Future Appointments: Provider: Department: Dept Phone: Center:   01/05/2012 9:00 AM Marca Ancona, MD [facility] [phone] [facility]     Future Orders Please Complete By Expires   Diet - low sodium heart healthy      Increase activity slowly      Call MD for:  temperature >100.4       Call MD for:  persistant nausea and vomiting      Call MD for:  redness, tenderness, or signs of infection (pain, swelling, redness, odor or green/yellow discharge around incision site)         Disposition: Patient to follow up with Dr. Madilyn Fireman if he has any further questions or issues with PEG tube malfunction, bleeding, to followup with myself in my office in approximately 2 weeks at which time will need CBC, be met, will complete proton pump for at least 8 weeks.  Follow-up Appts: Follow-up with Dr. Felipa Eth at [facility] in 2weeks Call for appointment.  Condition on Discharge: stable  Tests Needing Follow-up: CBC,BMET  Signed: Amya Hlad R 12/25/2011, 11:41 AM

## 2011-12-25 NOTE — Progress Notes (Signed)
Patient ID: Corey [name], male   DOB: 1928/12/12, 78 y.o.   MRN: [id number]   No further evidence of bleeding from PEG ulcer  Ok for d/c from surgery standpoint  Will sign off

## 2011-12-26 LAB — COMPREHENSIVE METABOLIC PANEL
Albumin: 3.1 g/dL — ABNORMAL LOW (ref 3.5–5.2)
BUN: 23 mg/dL (ref 6–23)
Calcium: 8.3 mg/dL — ABNORMAL LOW (ref 8.4–10.5)
Chloride: 112 mEq/L (ref 96–112)
Creatinine, Ser: 0.71 mg/dL (ref 0.50–1.35)
GFR calc non Af Amer: 86 mL/min — ABNORMAL LOW (ref >90)
Total Bilirubin: 0.5 mg/dL (ref 0.3–1.2)

## 2011-12-26 LAB — CBC
MCV: 93.1 fL (ref 78.0–100.0)
Platelets: 95 10*3/uL — ABNORMAL LOW (ref 150–400)
RBC: 2.62 MIL/uL — ABNORMAL LOW (ref 4.22–5.81)
WBC: 4.8 10*3/uL (ref 4.0–10.5)

## 2011-12-26 MED ORDER — POTASSIUM CHLORIDE 20 MEQ/15ML (10%) PO LIQD
20.0000 meq | Freq: Once | ORAL | Status: AC
  Administered 2011-12-26: 20 meq
  Filled 2011-12-26: qty 15

## 2011-12-26 NOTE — Progress Notes (Signed)
DISCHARGE SUMMARY-addendum  Corey [name]  MR#: [id number]  DOB:[birth year]  Date of Admission: 12/23/2011 Date of Discharge: 12/26/2011  Attending Physician:Corey [name]  Patient's WUJ:WJXB,JYNWGNFAOZ R, MD, MD  Consults: Corey. Dulce [name]; CCS  Discharge Diagnoses: Principal Problem:  *Upper GI bleed Active Problems:  HTN (hypertension)  PEG tube malfunction  Anemia  Hypotension   Discharge Medications: Current Discharge Medication List    START taking these medications   Details  loratadine (CLARITIN) 5 MG/5ML syrup Place 10 mLs (10 mg total) into feeding tube daily as needed. Qty: 120 mL, Refills: 12    Nutritional Supplements (TWOCAL HN) LIQD Place 1,185 mLs (5 Cans total) into feeding tube 4 (four) times daily. Qty: 120 Can, Refills: 12    pantoprazole sodium (PROTONIX) 40 mg/20 mL PACK Place 20 mLs (40 mg total) into feeding tube daily at 12 noon. Qty: 30 each, Refills: 2      CONTINUE these medications which have CHANGED   Details  aspirin 81 MG tablet-2 weeks after discharge  Take 1 tablet (81 mg total) by mouth daily. Qty: 30 tablet, Refills: 12      CONTINUE these medications which have NOT CHANGED   Details  irbesartan (AVAPRO) 150 MG tablet Take 150 mg by mouth at bedtime.    loratadine (CLARITIN) 10 MG tablet Take 10 mg by mouth daily.        History of Present Illness: 16 M with MMP who has a PEG b/c of Achylasia placed in March, 2012 by Corey [name] and has needed it replaced 3 Times. It was recently upsized by IR. He was seen by GI and CCS (Corey Corey [name]) as an outpt on 12/22/11 b/c of issues with bleeding/BRB oozing @ the PEG site. Corey Corey [name] used the balloon to tamponade what he thought was an ab wall bleeder. He followed that up with topical hemostat and silicone ring to continue the tamponade. He was sent home with plans to hold ASA for 3 days and hold TFs for a few hrs. Later he developed increased bleeding @ the PEG and called [facility] and sent to ED. He was  evaluated by ED MD, Corey [name] and CCS. During Eval he had a witnessed episode of melena - none prior. He denied any abdominal pain, nausea, vomiting. He takes ASA/day, but no NSAIDs. B/C of the Bleeding and Melena he was endoscoped and found small pressure ulcer under balloon. No active bleeding but he did have some clot in his stomach. No surgery is required at this time but surgery has recommended admission and continued observation with serial HGB. His bumper was loosened. Both GI and CCS felt that due to the complexities of his med issues that they would want him on the Internal medicine Service. As he awoke from procedure and while anesthesia wore off he dropped his BPs into 70's and required IVF boluses. Blood is on order but has not needed to be given. He is currently comfortable and HD stable  Hospital Course: Patient's blood pressure stabilized, patient remained off of his antihypertensive medication, was given a significant amount of IV fluids, no evidence of pulmonary edema but did have some lower extremity edema. Remained on proton pump inhibitor, feeds were reinitiated on January 13, tolerated relatively well however did have some issues with nausea at first. No active vomiting. Blood in the stools likely secondary to ischemic ulceration around the internal bumper balloon, as a result of the internal bumper being too tight. Both gastroenterology as well as general surgery continued  to follow the patient, recommended reinitiating PEG tube feedings, proton pump inhibitor from the PEG tube for approximately 8 weeks and if he rebleeds we'll probably need proton pump inhibitor indefinitely. On the day of discharge, patient's wife and patient had multiple questions with respect to the PEG tube, gastroenterology had signed off, advised them to followup with gastroenterology, Corey [name] for colleague for further management. Did have one dark stool however patient's wife stated that this may have been looser  than typical but same color of his typical and normal stools. Patient did have some nausea after second tube feeds however this quickly resolved without intervention. They were anxious for discharge and were in  agreement with outpatient management.  Patient on the day of discharge did have 3-4 episodes of melena, presumably secondary to old blood from his upper GI bleed, patient remained hemodynamically stable, hemoglobin remained stable ranging from 8.3-8.7 over the next 24 hours, patient's discharge was held as a result of this, and GI did see the patient one more time, also felt that melena was consistent with old blood, patient did have some issues with irritation around the PEG tube site treated with antifungal powder, patient felt comfortable and appropriate for discharge on January 15. Will supplement potassium.  Day of Discharge Exam BP 164/71  Pulse 79  Temp(Src) 97.9 F (36.6 C) (Oral)  Resp 20  Ht 5\' 9"  (1.753 m)  Wt 68.765 kg (151 lb 9.6 oz)  BMI 22.39 kg/m2  SpO2 97%  Physical Exam: General appearance: alert, cooperative and no distress Eyes: no scleral icterus Throat: oropharynx moist without erythema Resp: clear to auscultation bilaterally Cardio: regular rate and rhythm Dementia-soft, nontender, bowel sounds present, PEG tube clean dry and intact-with minimal erythema and no active discharge Extremities: no clubbing, cyanosis trace to 1+ edema  Discharge Labs:  [facility] 12/26/11 0504 12/25/11 0448  NA 145 144  K 3.1* 3.6  CL 112 112  CO2 24 25  GLUCOSE 91 94  BUN 23 27*  CREATININE 0.71 0.74  CALCIUM 8.3* 8.5  MG -- --  PHOS -- --    Basename 12/26/11 0504 12/25/11 0448  AST 38* 28  ALT 22 17  ALKPHOS 83 87  BILITOT 0.5 0.5  PROT 5.4* 5.3*  ALBUMIN 3.1* 3.0*    Basename 12/26/11 0504 12/25/11 1520  WBC 4.8 4.6  NEUTROABS -- 3.5  HGB 8.5* 8.7*  HCT 24.4* 25.0*  MCV 93.1 92.9  PLT 95* 100*   No results found for this basename:  CKTOTAL:3,CKMB:3,CKMBINDEX:3,TROPONINI:3 in the last 72 hours No results found for this basename: TSH,T4TOTAL,FREET3,T3FREE,THYROIDAB in the last 72 hours No results found for this basename: VITAMINB12:2,FOLATE:2,FERRITIN:2,TIBC:2,IRON:2,RETICCTPCT:2 in the last 72 hours  Discharge instructions: Discharge Orders    Future Appointments: Provider: Department: Dept Phone: Center:   01/05/2012 9:00 AM Corey Ancona, MD [facility] [phone] [facility]     Future Orders Please Complete By Expires   Diet - low sodium heart healthy      Increase activity slowly      Call MD for:  temperature >100.4      Call MD for:  persistant nausea and vomiting      Call MD for:  redness, tenderness, or signs of infection (pain, swelling, redness, odor or green/yellow discharge around incision site)         Disposition: Patient to follow up with Corey [name] if he has any further questions or issues with PEG tube malfunction, bleeding, to followup with myself in my  office in approximately 2 weeks at which time will need CBC, be met, will complete proton pump for at least 8 weeks.  Follow-up Appts: Follow-up with Corey [name] at [facility] in 2weeks Call for appointment.  Condition on Discharge: stable  Tests Needing Follow-up: CBC,BMET, iron, TIBC  Signed: Abubakr Wieman [name] 12/26/2011, 7:34 AM

## 2011-12-26 NOTE — Progress Notes (Signed)
@  0200 Pt called RN to room to change dressing around PEG site. Green drainage noted to lower portion of dressing below PEG insertion site. Pt complained of itching at site. Prior dressing removed. Redness noted around left and bottom of PEG site that was not previously noted at earlier shift dressing change. Site cleansed with sterile water and sterile gauze. Pt requested application of antifungal powder which he says he uses at home. Very small amount of antifungal powder lightly dusted around site and new dressing applied. Pt verbalized relief. Will monitor.

## 2011-12-26 NOTE — Progress Notes (Signed)
12/26/11 0943 patient's spouse is present to take patient home by car. Discharge instructions and education given to patient. Patient is happy to be going home.

## 2012-01-05 ENCOUNTER — Ambulatory Visit (INDEPENDENT_AMBULATORY_CARE_PROVIDER_SITE_OTHER): Payer: Medicare Other | Admitting: Cardiology

## 2012-01-05 ENCOUNTER — Encounter: Payer: Self-pay | Admitting: Cardiology

## 2012-01-05 VITALS — BP 110/59 | HR 80 | Ht 69.0 in | Wt 143.4 lb

## 2012-01-05 DIAGNOSIS — K922 Gastrointestinal hemorrhage, unspecified: Secondary | ICD-10-CM

## 2012-01-05 DIAGNOSIS — R0602 Shortness of breath: Secondary | ICD-10-CM

## 2012-01-05 DIAGNOSIS — I359 Nonrheumatic aortic valve disorder, unspecified: Secondary | ICD-10-CM

## 2012-01-05 DIAGNOSIS — I351 Nonrheumatic aortic (valve) insufficiency: Secondary | ICD-10-CM

## 2012-01-05 DIAGNOSIS — E785 Hyperlipidemia, unspecified: Secondary | ICD-10-CM

## 2012-01-05 LAB — CBC WITH DIFFERENTIAL/PLATELET
Basophils Relative: 0.5 % (ref 0.0–3.0)
Eosinophils Relative: 0.9 % (ref 0.0–5.0)
HCT: 35 % — ABNORMAL LOW (ref 39.0–52.0)
Hemoglobin: 11.9 g/dL — ABNORMAL LOW (ref 13.0–17.0)
Lymphs Abs: 1 10*3/uL (ref 0.7–4.0)
MCV: 95.7 fl (ref 78.0–100.0)
Monocytes Absolute: 0.6 10*3/uL (ref 0.1–1.0)
RBC: 3.65 Mil/uL — ABNORMAL LOW (ref 4.22–5.81)
WBC: 5.6 10*3/uL (ref 4.5–10.5)

## 2012-01-05 LAB — BASIC METABOLIC PANEL
CO2: 29 mEq/L (ref 19–32)
Chloride: 107 mEq/L (ref 96–112)
Sodium: 142 mEq/L (ref 135–145)

## 2012-01-05 NOTE — Patient Instructions (Addendum)
Your physician recommends that you have  lab work today--BMET/BNP/CBC   Your physician has requested that you have an echocardiogram. Echocardiography is a painless test that uses sound waves to create images of your heart. It provides your doctor with information about the size and shape of your heart and how well your heart's chambers and valves are working. This procedure takes approximately one hour. There are no restrictions for this procedure.  Schedule an appointment to see Sunday Spillers  in 4 months.  Your physician wants you to follow-up in: 8 months with Dr [name]. Betsey Holiday) You will receive a reminder letter in the mail two months in advance. If you don't receive a letter, please call our office to schedule the follow-up appointment.

## 2012-01-06 NOTE — Assessment & Plan Note (Signed)
Recent UGI bleed from PEG tube - related ulcer.  Will recheck hemoglobin today to make sure it is heading up.

## 2012-01-06 NOTE — Assessment & Plan Note (Signed)
Last lipid profile from 7/12 with acceptable LDL.  Will continue ASA 81 mg daily given mild CAD on 2006 cath.

## 2012-01-06 NOTE — Assessment & Plan Note (Addendum)
Prominent diastolic murmur.  At least moderate AI by last echo.  He does not look volume overloaded on exam.  He would be a poor surgical candidate given comorbidities and difficulty with past surgery (post-op encephalopathy).  There is some data the afterload reduction can help chronic aortic insufficiency.  Continue irbesartan at current dose.  Will consider uptitration at next appointment.  I will get an echo to reassess AI.  I will check BMET and BNP today.

## 2012-01-06 NOTE — Progress Notes (Signed)
PCP: Dr. Felipa Eth  78 yo with history of achalasia and PEG tube, at least moderate aortic insufficiency, and AAA repair complicated by post-op encephalopathy presents for cardiology followup.  He has been seen by Dr. Deborah Chalk in the past and is seen by me for the first time today.  He was admitted earlier this month with melena and was found on endoscopy to have an ulcer related to his PEG tube.  Recently, he has seen no further melena or BRBPR, but he has noted clear fluid oozing at his PEG site.    Last echo in 3/12 showed moderate aortic insufficiency with dilated aortic root.  He has been doing well from a cardiac standpoint with no significant exertional dyspnea (though he is not particularly active).  No orthopnea or PND, no chest pain.  He has been gaining back some weight.   Labs (7/12): LDL 83, HDL 76 Labs (1/13): K 3.1, creatinine 0.71, HCT 24.4  PMH: 1. Mild memory deficit 2. Achalasia: Severe, has PEG tube.  3. AAA repair in 2004.  Complicated by post-op encephalopathy and prolonged hospitalization.  4. Aortic insufficiency: Echo (3/12) with EF 55-60%, grade II diastolic dysfunction, moderate AI, PA systolic pressure 36 mmHg, dilated aortic root.  5. CAD: LHC 2006 with 40-50% proximal LAD.  6. Hyperlipidemia 7. HTN 8. H/o TURP  SH: Lives in [place] with wife, has 4 children. Quit smoking in 1984.    FH: No premature CAD.   ROS: All systems reviewed and negative except as per HPI.   Current Outpatient Prescriptions  Medication Sig Dispense Refill  . aspirin 81 MG tablet Take 1 tablet (81 mg total) by mouth daily.  30 tablet  12  . irbesartan (AVAPRO) 150 MG tablet Take 150 mg by mouth at bedtime.      Marland Kitchen loratadine (CLARITIN) 10 MG tablet Take 10 mg by mouth daily.      . Nutritional Supplements (TWOCAL HN) LIQD Place 1,185 mLs (5 Cans total) into feeding tube 4 (four) times daily.  120 Can  12  . pantoprazole sodium (PROTONIX) 40 mg/20 mL PACK Place 20 mLs (40 mg total) into  feeding tube daily at 12 noon.  30 each  2    BP 110/59  Pulse 80  Ht 5\' 9"  (1.753 m)  Wt 65.046 kg (143 lb 6.4 oz)  BMI 21.18 kg/m2 General: NAD Neck: No JVD, no thyromegaly or thyroid nodule.  Lungs: Clear to auscultation bilaterally with normal respiratory effort. CV: Nondisplaced PMI.  Heart regular S1/S2, no S3/S4, 2/6 SEM RUSB, 3/6 diastolic murmur along the sternal border.  No peripheral edema.  No carotid bruit.  Normal pedal pulses.  Abdomen: Soft, nontender, no hepatosplenomegaly, no distention.  Neurologic: Alert and oriented x 3.  Psych: Normal affect. Extremities: No clubbing or cyanosis.

## 2012-01-10 ENCOUNTER — Ambulatory Visit (HOSPITAL_COMMUNITY): Payer: Medicare Other | Attending: Cardiology

## 2012-01-10 DIAGNOSIS — R0609 Other forms of dyspnea: Secondary | ICD-10-CM | POA: Insufficient documentation

## 2012-01-10 DIAGNOSIS — I359 Nonrheumatic aortic valve disorder, unspecified: Secondary | ICD-10-CM | POA: Insufficient documentation

## 2012-01-10 DIAGNOSIS — R0989 Other specified symptoms and signs involving the circulatory and respiratory systems: Secondary | ICD-10-CM | POA: Insufficient documentation

## 2012-01-10 DIAGNOSIS — I2510 Atherosclerotic heart disease of native coronary artery without angina pectoris: Secondary | ICD-10-CM | POA: Insufficient documentation

## 2012-01-10 DIAGNOSIS — Z87891 Personal history of nicotine dependence: Secondary | ICD-10-CM | POA: Insufficient documentation

## 2012-01-10 DIAGNOSIS — I10 Essential (primary) hypertension: Secondary | ICD-10-CM | POA: Insufficient documentation

## 2012-01-10 DIAGNOSIS — E785 Hyperlipidemia, unspecified: Secondary | ICD-10-CM | POA: Insufficient documentation

## 2012-01-31 NOTE — Discharge Summary (Signed)
DISCHARGE SUMMARY-addendum  Corey [name]  MR#: [id number]  DOB:06/10/1929  Date of Admission: 12/23/2011 Date of Discharge: 12/26/2011  Attending Physician:Yasmene Salomone R  Patient's PCP:Aadhira Heffernan R, MD, MD  Consults: Dr. Outlaw; [facility]  Discharge Diagnoses: Principal Problem:  *Upper GI bleed Active Problems:  HTN (hypertension)  PEG tube malfunction  Anemia  Hypotension   Discharge Medications: Current Discharge Medication List    START taking these medications   Details  loratadine (CLARITIN) 5 MG/5ML syrup Place 10 mLs (10 mg total) into feeding tube daily as needed. Qty: 120 mL, Refills: 12    Nutritional Supplements (TWOCAL HN) LIQD Place 1,185 mLs (5 Cans total) into feeding tube 4 (four) times daily. Qty: 120 Can, Refills: 12    pantoprazole sodium (PROTONIX) 40 mg/20 mL PACK Place 20 mLs (40 mg total) into feeding tube daily at 12 noon. Qty: 30 each, Refills: 2      CONTINUE these medications which have CHANGED   Details  aspirin 81 MG tablet-2 weeks after discharge  Take 1 tablet (81 mg total) by mouth daily. Qty: 30 tablet, Refills: 12      CONTINUE these medications which have NOT CHANGED   Details  irbesartan (AVAPRO) 150 MG tablet Take 150 mg by mouth at bedtime.    loratadine (CLARITIN) 10 MG tablet Take 10 mg by mouth daily.        History of Present Illness: 82 M with MMP who has a PEG b/c of Achylasia placed in March, 2012 by Dr Ingram and has needed it replaced 3 Times. It was recently upsized by IR. He was seen by GI and [facility] (Dr Streck) as an outpt on 12/22/11 b/c of issues with bleeding/BRB oozing @ the PEG site. Dr Streck used the balloon to tamponade what he thought was an ab wall bleeder. He followed that up with topical hemostat and silicone ring to continue the tamponade. He was sent home with plans to hold ASA for 3 days and hold TFs for a few hrs. Later he developed increased bleeding @ the PEG and called [facility] and sent to ED. He was  evaluated by ED MD, Dr Outlaw and [facility]. During Eval he had a witnessed episode of melena - none prior. He denied any abdominal pain, nausea, vomiting. He takes ASA/day, but no NSAIDs. B/C of the Bleeding and Melena he was endoscoped and found small pressure ulcer under balloon. No active bleeding but he did have some clot in his stomach. No surgery is required at this time but surgery has recommended admission and continued observation with serial HGB. His bumper was loosened. Both GI and [facility] felt that due to the complexities of his med issues that they would want him on the Internal medicine Service. As he awoke from procedure and while anesthesia wore off he dropped his BPs into 70's and required IVF boluses. Blood is on order but has not needed to be given. He is currently comfortable and HD stable  Hospital Course: Patient's blood pressure stabilized, patient remained off of his antihypertensive medication, was given a significant amount of IV fluids, no evidence of pulmonary edema but did have some lower extremity edema. Remained on proton pump inhibitor, feeds were reinitiated on January 13, tolerated relatively well however did have some issues with nausea at first. No active vomiting. Blood in the stools likely secondary to ischemic ulceration around the internal bumper balloon, as a result of the internal bumper being too tight. Both gastroenterology as well as general surgery continued   to follow the patient, recommended reinitiating PEG tube feedings, proton pump inhibitor from the PEG tube for approximately 8 weeks and if he rebleeds we'll probably need proton pump inhibitor indefinitely. On the day of discharge, patient's wife and patient had multiple questions with respect to the PEG tube, gastroenterology had signed off, advised them to followup with gastroenterology, Dr. Hayes for colleague for further management. Did have one dark stool however patient's wife stated that this may have been looser  than typical but same color of his typical and normal stools. Patient did have some nausea after second tube feeds however this quickly resolved without intervention. They were anxious for discharge and were in  agreement with outpatient management.  Patient on the day of discharge did have 3-4 episodes of melena, presumably secondary to old blood from his upper GI bleed, patient remained hemodynamically stable, hemoglobin remained stable ranging from 8.3-8.7 over the next 24 hours, patient's discharge was held as a result of this, and GI did see the patient one more time, also felt that melena was consistent with old blood, patient did have some issues with irritation around the PEG tube site treated with antifungal powder, patient felt comfortable and appropriate for discharge on January 15. Will supplement potassium.  Day of Discharge Exam BP 164/71  Pulse 79  Temp(Src) 97.9 F (36.6 C) (Oral)  Resp 20  Ht 5' 9" (1.753 m)  Wt 68.765 kg (151 lb 9.6 oz)  BMI 22.39 kg/m2  SpO2 97%  Physical Exam: General appearance: alert, cooperative and no distress Eyes: no scleral icterus Throat: oropharynx moist without erythema Resp: clear to auscultation bilaterally Cardio: regular rate and rhythm Dementia-soft, nontender, bowel sounds present, PEG tube clean dry and intact-with minimal erythema and no active discharge Extremities: no clubbing, cyanosis trace to 1+ edema  Discharge Labs:  Basename 12/26/11 0504 12/25/11 0448  NA 145 144  K 3.1* 3.6  CL 112 112  CO2 24 25  GLUCOSE 91 94  BUN 23 27*  CREATININE 0.71 0.74  CALCIUM 8.3* 8.5  MG -- --  PHOS -- --    Basename 12/26/11 0504 12/25/11 0448  AST 38* 28  ALT 22 17  ALKPHOS 83 87  BILITOT 0.5 0.5  PROT 5.4* 5.3*  ALBUMIN 3.1* 3.0*    Basename 12/26/11 0504 12/25/11 1520  WBC 4.8 4.6  NEUTROABS -- 3.5  HGB 8.5* 8.7*  HCT 24.4* 25.0*  MCV 93.1 92.9  PLT 95* 100*   No results found for this basename:  CKTOTAL:3,CKMB:3,CKMBINDEX:3,TROPONINI:3 in the last 72 hours No results found for this basename: TSH,T4TOTAL,FREET3,T3FREE,THYROIDAB in the last 72 hours No results found for this basename: VITAMINB12:2,FOLATE:2,FERRITIN:2,TIBC:2,IRON:2,RETICCTPCT:2 in the last 72 hours  Discharge instructions: Discharge Orders    Future Appointments: Provider: Department: Dept Phone: Center:   01/05/2012 9:00 AM Dalton McLean, MD [facility] [phone] [facility]     Future Orders Please Complete By Expires   Diet - low sodium heart healthy      Increase activity slowly      Call MD for:  temperature >100.4      Call MD for:  persistant nausea and vomiting      Call MD for:  redness, tenderness, or signs of infection (pain, swelling, redness, odor or green/yellow discharge around incision site)         Disposition: Patient to follow up with Dr. Hayes if he has any further questions or issues with PEG tube malfunction, bleeding, to followup with myself in my   office in approximately 2 weeks at which time will need CBC, be met, will complete proton pump for at least 8 weeks.  Follow-up Appts: Follow-up with Dr. Tiffine Henigan at [facility] in 2weeks Call for appointment.  Condition on Discharge: stable  Tests Needing Follow-up: CBC,BMET, iron, TIBC  Signed: Adanna Zuckerman R 12/26/2011, 7:34 AM    

## 2012-02-17 ENCOUNTER — Other Ambulatory Visit (HOSPITAL_COMMUNITY): Payer: Self-pay | Admitting: Interventional Radiology

## 2012-02-17 ENCOUNTER — Ambulatory Visit (HOSPITAL_COMMUNITY)
Admission: RE | Admit: 2012-02-17 | Discharge: 2012-02-17 | Disposition: A | Discharge status: Home / Self Care | Payer: Medicare Other | Source: Ambulatory Visit | Attending: Interventional Radiology | Admitting: Interventional Radiology

## 2012-02-17 DIAGNOSIS — Z431 Encounter for attention to gastrostomy: Secondary | ICD-10-CM | POA: Insufficient documentation

## 2012-02-17 DIAGNOSIS — R633 Feeding difficulties: Secondary | ICD-10-CM

## 2012-02-17 NOTE — Procedures (Signed)
Procedure:  Gastrostomy replacement New 22 Fr balloon retention gastrostomy placed.  Tip confirmed in stomach by fluoro after contrast injection.

## 2012-03-29 ENCOUNTER — Telehealth: Payer: Self-pay | Admitting: Cardiology

## 2012-03-29 NOTE — Telephone Encounter (Signed)
LMTCB--NT 

## 2012-03-29 NOTE — Telephone Encounter (Signed)
New Problem:     Patient called in wanting Dr. [name] to give him the ok to have a tooth extraction next week.  Please call back.

## 2012-03-29 NOTE — Telephone Encounter (Signed)
Corey [name] calling back stating 3 teeth need pulling(he's going to oral surgeon) the oral surgeon has given him RX for antibiotics--is there any reason he cannot continue with extractions?

## 2012-04-01 NOTE — Telephone Encounter (Signed)
Spoke with pt's wife. She is aware of Dr Alford Highland recommendation.

## 2012-04-01 NOTE — Telephone Encounter (Signed)
He can have the tooth extractions.

## 2012-04-21 ENCOUNTER — Ambulatory Visit (HOSPITAL_COMMUNITY)
Admission: RE | Admit: 2012-04-21 | Discharge: 2012-04-21 | Disposition: A | Discharge status: Home / Self Care | Payer: Medicare Other | Source: Ambulatory Visit | Attending: Interventional Radiology | Admitting: Interventional Radiology

## 2012-04-21 ENCOUNTER — Other Ambulatory Visit (HOSPITAL_COMMUNITY): Payer: Self-pay | Admitting: Interventional Radiology

## 2012-04-21 DIAGNOSIS — R52 Pain, unspecified: Secondary | ICD-10-CM

## 2012-04-21 DIAGNOSIS — Z431 Encounter for attention to gastrostomy: Secondary | ICD-10-CM | POA: Insufficient documentation

## 2012-04-21 MED ORDER — IOHEXOL 300 MG/ML  SOLN
10.0000 mL | Freq: Once | INTRAMUSCULAR | Status: AC | PRN
  Administered 2012-04-21: 10 mL

## 2012-04-21 NOTE — Procedures (Signed)
Procedure:  Gastrostomy replacement New 22 Fr balloon retention tube placed.  Tip in body of stomach.  OK to use.

## 2012-05-07 ENCOUNTER — Encounter: Payer: Self-pay | Admitting: Nurse Practitioner

## 2012-05-07 ENCOUNTER — Ambulatory Visit (INDEPENDENT_AMBULATORY_CARE_PROVIDER_SITE_OTHER): Payer: Medicare Other | Admitting: Nurse Practitioner

## 2012-05-07 VITALS — BP 152/70 | HR 75 | Ht 69.0 in | Wt 144.0 lb

## 2012-05-07 DIAGNOSIS — I359 Nonrheumatic aortic valve disorder, unspecified: Secondary | ICD-10-CM

## 2012-05-07 DIAGNOSIS — I10 Essential (primary) hypertension: Secondary | ICD-10-CM

## 2012-05-07 DIAGNOSIS — I959 Hypotension, unspecified: Secondary | ICD-10-CM

## 2012-05-07 DIAGNOSIS — E785 Hyperlipidemia, unspecified: Secondary | ICD-10-CM

## 2012-05-07 MED ORDER — IRBESARTAN 150 MG PO TABS: 150.0000 mg | ORAL_TABLET | Freq: Every day | ORAL | Status: DC

## 2012-05-07 NOTE — Assessment & Plan Note (Signed)
Is felt to have severe AI per last echo. He seems to be getting more symptomatic. Will go ahead and uptitrate his ARB per Dr. Alford Highland suggestion at his last visit. He is felt to be a poor surgical candidate given his comorbidities and difficulty with surgery. However, they may want to be considering and have asked about seeing a surgeon before making any decision. I will see him back in a month with Dr. [name]. His wife will monitor his blood pressure at home. Will ask Dr. [name] to review as well. Patient is agreeable to this plan and will call if any problems develop in the interim.

## 2012-05-07 NOTE — Patient Instructions (Addendum)
We are increasing your Avapro to 150 mg two times a day  Check your blood pressure at home and keep a diary  We are checking some labs today  Dr. [name] and I will see you in one month.  Call the [facility] office at [phone] if you have any questions, problems or concerns.

## 2012-05-07 NOTE — Progress Notes (Signed)
Corey [name] Date of Birth: 1929-03-15 Medical Record #[id number]  History of Present Illness: Corey [name] is seen today for a 4 month check. He is seen for Dr. [name]. He has severe AI. Has a chronic PEG tube in place due to achalasia. Had a remote AAA that left him encephalopathic but he did recover. He has generally not done well when hospitalized.  He comes in today. He is here with his wife. He says he is doing ok but his wife notes more DOE and fatigue. Blood pressure has been ok at home. No chest pain. Still using the PEG tube. It makes their social life frustrating. His weight is now holding in the 150's.   Current Outpatient Prescriptions on File Prior to Visit  Medication Sig Dispense Refill  . aspirin 81 MG tablet Take 1 tablet (81 mg total) by mouth daily.  30 tablet  12  . irbesartan (AVAPRO) 150 MG tablet Take 150 mg by mouth at bedtime.      Marland Kitchen loratadine (CLARITIN) 10 MG tablet Take 10 mg by mouth daily.      . Nutritional Supplements (TWOCAL HN) LIQD Place 1,185 mLs (5 Cans total) into feeding tube 4 (four) times daily.  120 Can  12    Allergies  Allergen Reactions  . Iohexol Hives    Past Medical History  Diagnosis Date  . Aortic insufficiency   . Achalasia, esophageal     s/p PEG tube  . Dysphagia   . Hypertension   . Encephalopathy     Post AAA surgery  . Hyperlipidemia   . Dementia   . Bruises easily   . PVD (peripheral vascular disease)   . AAA (abdominal aortic aneurysm)   . GI bleed January 2013    thru PEG tube with associated mild shock    Past Surgical History  Procedure Date  . Cardiac catheterization 09/19/2005    NORMAL. EF 65%  . Abdominal aortic aneurysm repair   . Transurethral resection of prostate   . Hernia repair   . Peg placement 2012  . Esophagogastroduodenoscopy 12/23/2011    Procedure: ESOPHAGOGASTRODUODENOSCOPY (EGD);  Surgeon: [name], MD;  Location: [facility];  Service: Endoscopy;  Laterality: N/A;    History  Smoking  status  . Former Smoker  . Quit date: 12/11/1982  Smokeless tobacco  . Never Used    History  Alcohol Use No    Family History  Problem Relation Age of Onset  . Aneurysm Mother   . Heart disease Mother   . Stomach cancer Father   . Cancer Father     colon  . Cancer Brother     lung  . Heart disease Sister     Review of Systems: The review of systems is per the HPI.  All other systems were reviewed and are negative.  Physical Exam: BP 152/70  Pulse 75  Ht 5\' 9"  (1.753 m)  Wt 144 lb (65.318 kg)  BMI 21.27 kg/m2 Patient is very pleasant and in no acute distress.  He is very alert and appropriate. Skin is warm and dry. Color is normal.  HEENT is unremarkable. Normocephalic/atraumatic. PERRL. Sclera are nonicteric. Neck is supple. No masses. No JVD. Lungs are clear. Cardiac exam shows a regular rate and rhythm. He has a loud murmur of AI noted. Abdomen is soft. Extremities are without edema. Gait and ROM are intact. No gross neurologic deficits noted.  LABORATORY DATA: Labs for today are pending.    Echo  Study Conclusions from January 2013  - Left ventricle: The cavity size was moderately dilated. Wall thickness was increased in a pattern of mild LVH. There was mild focal basal hypertrophy of the septum. Systolic function was normal. The estimated ejection fraction was in the range of 55% to 60%. Wall motion was normal; there were no regional wall motion abnormalities. Doppler parameters are consistent with abnormal left ventricular relaxation (grade 1 diastolic dysfunction). - Aortic valve: Trileaflet; mildly calcified leaflets. Mildly elevated gradient across the aortic valve may be due to aortic insufficiency versus LV outflow tract gradient from [facility]. The valve opens well and does not appear stenosed. Severe regurgitation. Mean gradient: 15mm Hg (S). Peak gradient: 26mm Hg (S). - Aorta: Aortic root and ascending aorta aneurysm. Aortic root dimension: 44mm (ED).  Ascending aortic diameter: 44mm (S). - Mitral valve: Mild regurgitation. There appears to be chordaland valvular systolic anterior motion. - Left atrium: The atrium was mildly dilated. - Right ventricle: The cavity size was normal. Systolic function was normal. - Tricuspid valve: Peak RV-RA gradient: 25mm Hg (S). - Pulmonary arteries: PA systolic pressure 31-35 mmHg. - Systemic veins: IVC measured 2.1 cm with normal respirophasic variation, suggesting RA pressure 6-10 mmHg. Impressions:  - Moderately dilated LV with normal systolic function, EF 55-60%. Dilated aortic root and ascending aorta (4.4 cm). Severe aortic regurgitation. There was mild basal septal hypertrophy of the LV with systolic anterior motion of the chord and mitral valve. There was a mildly elevated gradient across the aortic valve due to LV outflow tract gradient versus due to high flow with aortic insufficiency. There did not appear to be significant aortic stenosis. Mild mitral regurgitation. Normal RV size and systolic function.     Assessment / Plan:

## 2012-05-07 NOTE — Assessment & Plan Note (Signed)
This was in the setting of his prior GI bleed. Blood pressure in the 140's at home. Will try to uptitrate his ARB. Wife will continue to monitor.

## 2012-05-09 NOTE — Progress Notes (Signed)
If Corey [name] would like to talk to a surgeon about options/risks, would refer him to see Dr. Cornelius Moras or Dr. Laneta Simmers.  Would do this sooner rather than later given severe AI if he would consider surgery.

## 2012-05-10 LAB — BASIC METABOLIC PANEL WITH GFR
BUN: 34 mg/dL — ABNORMAL HIGH (ref 6–23)
CO2: 26 meq/L (ref 19–32)
Calcium: 9.3 mg/dL (ref 8.4–10.5)
Chloride: 111 meq/L (ref 96–112)
Creatinine, Ser: 0.7 mg/dL (ref 0.4–1.5)
GFR: 107.99 mL/min
Glucose, Bld: 65 mg/dL — ABNORMAL LOW (ref 70–99)
Potassium: 4.3 meq/L (ref 3.5–5.1)
Sodium: 145 meq/L (ref 135–145)

## 2012-05-10 LAB — LIPID PANEL: VLDL: 26 mg/dL (ref 0.0–40.0)

## 2012-05-10 LAB — HEPATIC FUNCTION PANEL
ALT: 21 U/L (ref 0–53)
AST: 30 U/L (ref 0–37)
Albumin: 4.3 g/dL (ref 3.5–5.2)
Alkaline Phosphatase: 136 U/L — ABNORMAL HIGH (ref 39–117)
Bilirubin, Direct: 0.2 mg/dL (ref 0.0–0.3)
Total Bilirubin: 1.5 mg/dL — ABNORMAL HIGH (ref 0.3–1.2)
Total Protein: 7.1 g/dL (ref 6.0–8.3)

## 2012-05-15 ENCOUNTER — Telehealth: Payer: Self-pay | Admitting: Cardiology

## 2012-05-15 NOTE — Telephone Encounter (Signed)
PT NOTIFIED OF LAB RESULTS

## 2012-05-15 NOTE — Telephone Encounter (Signed)
Follow-up:    Patient called returning Corey [name]'s call regarding his blood work.  Please call back.

## 2012-06-07 ENCOUNTER — Encounter: Payer: Self-pay | Admitting: Nurse Practitioner

## 2012-06-07 ENCOUNTER — Ambulatory Visit (INDEPENDENT_AMBULATORY_CARE_PROVIDER_SITE_OTHER): Payer: Medicare Other | Admitting: Nurse Practitioner

## 2012-06-07 VITALS — BP 144/62 | HR 78 | Ht 69.0 in | Wt 146.0 lb

## 2012-06-07 DIAGNOSIS — I10 Essential (primary) hypertension: Secondary | ICD-10-CM

## 2012-06-07 DIAGNOSIS — I351 Nonrheumatic aortic (valve) insufficiency: Secondary | ICD-10-CM

## 2012-06-07 DIAGNOSIS — I359 Nonrheumatic aortic valve disorder, unspecified: Secondary | ICD-10-CM

## 2012-06-07 MED ORDER — IRBESARTAN 150 MG PO TABS: 150.0000 mg | ORAL_TABLET | Freq: Two times a day (BID) | ORAL | Status: DC

## 2012-06-07 NOTE — Assessment & Plan Note (Signed)
He may be getting symptomatic from his AI. He and his wife have decided to not pursue surgical intervention. The fear of another prolonged hospitalization and encephalopy is too great for them. He has opted for conservative management. I will see him back in about 4 months. Will continue with his ARB therapy. Patient is agreeable to this plan and will call if any problems develop in the interim.

## 2012-06-07 NOTE — Assessment & Plan Note (Addendum)
Their blood pressure diary is reviewed. For the most part he has satisfactory readings. No change in his current regimen. He will need a BMET on his return visit.

## 2012-06-07 NOTE — Progress Notes (Signed)
Corey [name] Date of Birth: 24-Sep-1929 Medical Record #[id number]  History of Present Illness: Corey [name] is seen today for a one month check. He is seen for Dr. Shirlee [name]. He has severe AI. Last echo in January of this year. Still with normal systolic function. He has a chronic PEG in place due to achalasia. Had a remote AAA that left him encophalopathic but he did recover. He was hospitalized for 43 days. He has generally not done well with any hospital stay.   He comes in today. He is here with his wife Eber Jones. She had noted increasing fatigue and more DOE. His ARB has been increased. He is doing about the same. They are mostly consumed with care of this PEG tube. He does try to stay active. He has gained 2 pounds. He does not really note DOE but his wife does. No chest pain. No syncope.   Current Outpatient Prescriptions on File Prior to Visit  Medication Sig Dispense Refill  . aspirin 81 MG tablet Take 1 tablet (81 mg total) by mouth daily.  30 tablet  12  . loratadine (CLARITIN) 10 MG tablet Take 10 mg by mouth daily.      . Nutritional Supplements (TWOCAL HN) LIQD Place 1,185 mLs (5 Cans total) into feeding tube 4 (four) times daily.  120 Can  12  . DISCONTD: irbesartan (AVAPRO) 150 MG tablet Take 1 tablet (150 mg total) by mouth at bedtime.  60 tablet  6    Allergies  Allergen Reactions  . Iohexol Hives    Past Medical History  Diagnosis Date  . Aortic insufficiency     severe per echo January 2013  . Achalasia, esophageal     s/p PEG tube  . Dysphagia   . Hypertension   . Encephalopathy     Post AAA surgery  . Hyperlipidemia   . Dementia   . Bruises easily   . PVD (peripheral vascular disease)   . AAA (abdominal aortic aneurysm)   . GI bleed January 2013    thru PEG tube with associated mild shock    Past Surgical History  Procedure Date  . Cardiac catheterization 09/19/2005    NORMAL. EF 65%  . Abdominal aortic aneurysm repair   . Transurethral resection of prostate     . Hernia repair   . Peg placement 2012  . Esophagogastroduodenoscopy 12/23/2011    Procedure: ESOPHAGOGASTRODUODENOSCOPY (EGD);  Surgeon: [name], MD;  Location: [facility];  Service: Endoscopy;  Laterality: N/A;    History  Smoking status  . Former Smoker  . Quit date: 12/11/1982  Smokeless tobacco  . Never Used    History  Alcohol Use No    Family History  Problem Relation Age of Onset  . Aneurysm Mother   . Heart disease Mother   . Stomach cancer Father   . Cancer Father     colon  . Cancer Brother     lung  . Heart disease Sister     Review of Systems: The review of systems is per the HPI. All other systems were reviewed and are negative.  Physical Exam: BP 144/62  Pulse 78  Ht 5\' 9"  (1.753 m)  Wt 146 lb (66.225 kg)  BMI 21.56 kg/m2  SpO2 95% Patient is very pleasant and in no acute distress. He is quite thin but has gained 2 pounds over this past month. Skin is warm and dry. Color is normal.  HEENT is unremarkable. Normocephalic/atraumatic. PERRL. Sclera are  nonicteric. Neck is supple. No masses. No JVD. Lungs are clear. Cardiac exam shows a regular rate and rhythm. Loud murmur of AI noted. Abdomen is soft. Extremities are without edema. Gait and ROM are intact. No gross neurologic deficits noted.  LABORATORY DATA:   Lab Results  Component Value Date   WBC 5.6 01/05/2012   HGB 11.9* 01/05/2012   HCT 35.0* 01/05/2012   PLT 202.0 01/05/2012   GLUCOSE 65* 05/07/2012   CHOL 180 05/07/2012   TRIG 130.0 05/07/2012   HDL 55.00 05/07/2012   LDLCALC 99 05/07/2012   ALT 21 05/07/2012   AST 30 05/07/2012   NA 145 05/07/2012   K 4.3 05/07/2012   CL 111 05/07/2012   CREATININE 0.7 05/07/2012   BUN 34* 05/07/2012   CO2 26 05/07/2012   INR 1.17 12/23/2011    Assessment / Plan:

## 2012-06-07 NOTE — Patient Instructions (Addendum)
Stay on your current medicines  We will see you back in 4 months  Call the [facility] office at [phone] if you have any questions, problems or concerns.

## 2012-06-13 ENCOUNTER — Ambulatory Visit (HOSPITAL_COMMUNITY)
Admission: RE | Admit: 2012-06-13 | Discharge: 2012-06-13 | Disposition: A | Discharge status: Home / Self Care | Payer: Medicare Other | Source: Ambulatory Visit | Attending: Interventional Radiology | Admitting: Interventional Radiology

## 2012-06-13 ENCOUNTER — Other Ambulatory Visit: Payer: Self-pay | Admitting: Interventional Radiology

## 2012-06-13 ENCOUNTER — Other Ambulatory Visit (HOSPITAL_COMMUNITY): Payer: Self-pay | Admitting: Interventional Radiology

## 2012-06-13 DIAGNOSIS — E46 Unspecified protein-calorie malnutrition: Secondary | ICD-10-CM

## 2012-06-13 DIAGNOSIS — Y849 Medical procedure, unspecified as the cause of abnormal reaction of the patient, or of later complication, without mention of misadventure at the time of the procedure: Secondary | ICD-10-CM | POA: Insufficient documentation

## 2012-06-13 DIAGNOSIS — K9429 Other complications of gastrostomy: Secondary | ICD-10-CM | POA: Insufficient documentation

## 2012-06-13 NOTE — Procedures (Signed)
24FR BALLOON RETENTION G TUBE REPLACED CONFIRMED IN STOMACH W/ CONTRAST NO COMP READY FOR USE STABLE FULL REPORT IN PACS

## 2012-06-25 ENCOUNTER — Ambulatory Visit (HOSPITAL_COMMUNITY)
Admission: RE | Admit: 2012-06-25 | Discharge: 2012-06-25 | Disposition: A | Discharge status: Home / Self Care | Payer: Medicare Other | Source: Ambulatory Visit | Attending: Radiology | Admitting: Radiology

## 2012-06-25 ENCOUNTER — Other Ambulatory Visit (HOSPITAL_COMMUNITY): Payer: Self-pay | Admitting: Radiology

## 2012-06-25 DIAGNOSIS — Z431 Encounter for attention to gastrostomy: Secondary | ICD-10-CM | POA: Insufficient documentation

## 2012-06-25 MED ORDER — IOHEXOL 300 MG/ML  SOLN
10.0000 mL | Freq: Once | INTRAMUSCULAR | Status: AC | PRN
  Administered 2012-06-25: 10 mL

## 2012-06-26 ENCOUNTER — Telehealth: Payer: Self-pay | Admitting: Physician Assistant

## 2012-06-26 NOTE — Progress Notes (Signed)
Spouse called today - needs the prescription for nystatin powder which was recommended by our radiologists - just not called in.  The gastric tube insertion site is still inflamed per spouse with residual drainage around same.  Cleaning instructions provided to her.  A prescription was called into their pharmacy for nystatin powder to be applied 2-3 x a day as well as diflucan suspension 100 mg (35 ml bottle)  to be given daily through his gastric tube x 10 days.  She is to contact us for worsening symptoms or if site does not begin to resolve after completion of this Rx regimen.

## 2012-07-03 ENCOUNTER — Other Ambulatory Visit (HOSPITAL_COMMUNITY): Payer: Self-pay | Admitting: Physician Assistant

## 2012-07-03 ENCOUNTER — Telehealth: Payer: Self-pay | Admitting: Physician Assistant

## 2012-07-03 DIAGNOSIS — K9429 Other complications of gastrostomy: Secondary | ICD-10-CM

## 2012-07-03 NOTE — Progress Notes (Signed)
Spouse called - has finished diflucan and still using topical nystatin without much improvement.  Minimal drainage around insertion site - however with erythema and warmth. Informed her I am concerned he may be developing a cellulitis and would need to be seen and evaluate - patient to come to [facility] xray for g-tube evaluation tomorrow 7/25 at 10 am. Advised to use anti-inflammatory and warm heat in the interim to help with discomfort.

## 2012-07-04 ENCOUNTER — Ambulatory Visit (HOSPITAL_COMMUNITY)
Admission: RE | Admit: 2012-07-04 | Discharge: 2012-07-04 | Disposition: A | Discharge status: Home / Self Care | Payer: Medicare Other | Source: Ambulatory Visit | Attending: Physician Assistant | Admitting: Physician Assistant

## 2012-07-04 DIAGNOSIS — K9429 Other complications of gastrostomy: Secondary | ICD-10-CM

## 2012-07-04 NOTE — Consult Note (Signed)
WOC consult Call from [facility] in IR to request me to evaluate pts leaking Gtube site.  Pt and his wife report that this has been problematic for some time.  Wife is retired Engineer, civil (consulting) and has tried powders, creams, and ointments to relieve this area.  He has had Gtube replaced a number of times.  The Gtube is in a deep crease in the pts abdomen and this seems to be the issue.  He has leaking mostly from the o'clock area. Area does appear to be fungal and he has a drain sponge in place today.  They have met with some other WOC nurses in the community as well for assistance.  Today I have placed silicone foam dressing that will mold to the pts abdominal crease and will provide moisture wicking away from the skin plus the ability to remove gently.  The foam is very absorptive my hope will be that it will keep the moisture away from the skin and allow this area to heal.  I have sent one extra one home with the pt and his wife and I have requested the manufacturer to send more samples to the pts home address.  Provided pt and his wife with my contact information. Re consult if needed, will not follow at this time. Thanks  Kapil Petropoulos [employer], CWOCN [phone])

## 2012-07-13 ENCOUNTER — Encounter (HOSPITAL_COMMUNITY): Payer: Self-pay | Admitting: Emergency Medicine

## 2012-07-13 ENCOUNTER — Emergency Department (HOSPITAL_COMMUNITY)
Admission: EM | Admit: 2012-07-13 | Discharge: 2012-07-14 | Disposition: A | Discharge status: Home / Self Care | Payer: Medicare Other | Attending: Emergency Medicine | Admitting: Emergency Medicine

## 2012-07-13 DIAGNOSIS — I10 Essential (primary) hypertension: Secondary | ICD-10-CM | POA: Insufficient documentation

## 2012-07-13 DIAGNOSIS — K220 Achalasia of cardia: Secondary | ICD-10-CM | POA: Insufficient documentation

## 2012-07-13 DIAGNOSIS — K9429 Other complications of gastrostomy: Secondary | ICD-10-CM | POA: Insufficient documentation

## 2012-07-13 DIAGNOSIS — K9423 Gastrostomy malfunction: Secondary | ICD-10-CM

## 2012-07-13 DIAGNOSIS — I739 Peripheral vascular disease, unspecified: Secondary | ICD-10-CM | POA: Insufficient documentation

## 2012-07-13 DIAGNOSIS — E785 Hyperlipidemia, unspecified: Secondary | ICD-10-CM | POA: Insufficient documentation

## 2012-07-13 DIAGNOSIS — Z87891 Personal history of nicotine dependence: Secondary | ICD-10-CM | POA: Insufficient documentation

## 2012-07-13 LAB — COMPREHENSIVE METABOLIC PANEL
AST: 24 U/L (ref 0–37)
Alkaline Phosphatase: 135 U/L — ABNORMAL HIGH (ref 39–117)
BUN: 52 mg/dL — ABNORMAL HIGH (ref 6–23)
CO2: 27 mEq/L (ref 19–32)
Chloride: 109 mEq/L (ref 96–112)
Creatinine, Ser: 0.69 mg/dL (ref 0.50–1.35)
GFR calc non Af Amer: 87 mL/min — ABNORMAL LOW (ref >90)
Total Bilirubin: 0.4 mg/dL (ref 0.3–1.2)

## 2012-07-13 LAB — CBC WITH DIFFERENTIAL/PLATELET
Basophils Absolute: 0 10*3/uL (ref 0.0–0.1)
HCT: 32.6 % — ABNORMAL LOW (ref 39.0–52.0)
Hemoglobin: 11 g/dL — ABNORMAL LOW (ref 13.0–17.0)
Lymphocytes Relative: 25 % (ref 12–46)
Monocytes Absolute: 0.5 10*3/uL (ref 0.1–1.0)
Monocytes Relative: 9 % (ref 3–12)
Neutro Abs: 3.6 10*3/uL (ref 1.7–7.7)
RBC: 3.38 MIL/uL — ABNORMAL LOW (ref 4.22–5.81)
WBC: 5.5 10*3/uL (ref 4.0–10.5)

## 2012-07-13 LAB — SAMPLE TO BLOOD BANK

## 2012-07-13 NOTE — ED Notes (Addendum)
PER EMS- pt picked up from home with c/o external bleeding from peg tube.  While pt was using the bathroom he noticed the bleeding.  Vitals stable, pt alert and oriented.  Pt has dysphagia and a peg tube was placed April 2012. Was last replaced x2weeks ago, by Dr Lowella Dandy.    Also has hx of leaking aorta.

## 2012-07-13 NOTE — ED Notes (Signed)
[id number]<AV> Expected date:<BR> Expected time:<BR> Means of arrival:<BR> Comments:<BR> Medic 231, 82 M, G Tube bleeding, Rm 21

## 2012-07-13 NOTE — ED Provider Notes (Signed)
History     CSN: [id number]  Arrival date & time 07/13/12  2115   First MD Initiated Contact with Patient 07/13/12 2122      Chief Complaint  Patient presents with  . Feeding Intolerance    bleeding from peg tube    (Consider location/radiation/quality/duration/timing/severity/associated sxs/prior treatment) The history is provided by the patient and the spouse.   Patient here with bleeding around his G-tube site. History of same for multiple months. Has had this cauterized in the past as well as has had quick clot placed on it. The bleeding started just prior to arrival mostly controlled with direct pressure. No history of trauma. According to the wife, the G-tube is functioning properly Past Medical History  Diagnosis Date  . Aortic insufficiency     severe per echo January 2013  . Achalasia, esophageal     s/p PEG tube  . Dysphagia   . Hypertension   . Encephalopathy     Post AAA surgery  . Hyperlipidemia   . Dementia   . Bruises easily   . PVD (peripheral vascular disease)   . AAA (abdominal aortic aneurysm)   . GI bleed January 2013    thru PEG tube with associated mild shock    Past Surgical History  Procedure Date  . Cardiac catheterization 09/19/2005    NORMAL. EF 65%  . Abdominal aortic aneurysm repair   . Transurethral resection of prostate   . Hernia repair   . Peg placement 2012  . Esophagogastroduodenoscopy 12/23/2011    Procedure: ESOPHAGOGASTRODUODENOSCOPY (EGD);  Surgeon: [name], MD;  Location: [facility];  Service: Endoscopy;  Laterality: N/A;    Family History  Problem Relation Age of Onset  . Aneurysm Mother   . Heart disease Mother   . Stomach cancer Father   . Cancer Father     colon  . Cancer Brother     lung  . Heart disease Sister     History  Substance Use Topics  . Smoking status: Former Smoker    Quit date: 12/11/1982  . Smokeless tobacco: Never Used  . Alcohol Use: No      Review of Systems  All other  systems reviewed and are negative.    Allergies  Iohexol  Home Medications   Current Outpatient Rx  Name Route Sig Dispense Refill  . ASPIRIN 81 MG PO TABS Oral Take 1 tablet (81 mg total) by mouth daily. 30 tablet 12    Start 2 weeks after discharge!!!!  . IRBESARTAN 150 MG PO TABS Oral Take 1 tablet (150 mg total) by mouth 2 (two) times daily. 180 tablet 3  . LORATADINE 10 MG PO TABS Oral Take 10 mg by mouth daily.    Harriet Pho HN PO LIQD Per Tube Place 1,185 mLs (5 Cans total) into feeding tube 4 (four) times daily. 120 Can 12    BP 124/56  Temp 97.6 F (36.4 C) (Oral)  Resp 26  SpO2 96%  Physical Exam  Nursing note and vitals reviewed. Constitutional: He is oriented to person, place, and time. He appears well-developed and well-nourished.  Non-toxic appearance. No distress.  HENT:  Head: Normocephalic and atraumatic.  Eyes: Conjunctivae, EOM and lids are normal. Pupils are equal, round, and reactive to light.  Neck: Normal range of motion. Neck supple. No tracheal deviation present. No mass present.  Cardiovascular: Normal rate, regular rhythm and normal heart sounds.  Exam reveals no gallop.   No murmur heard. Pulmonary/Chest: Effort  normal and breath sounds normal. No stridor. No respiratory distress. He has no decreased breath sounds. He has no wheezes. He has no rhonchi. He has no rales.  Abdominal: Soft. Normal appearance and bowel sounds are normal. He exhibits no distension. There is no tenderness. There is no rebound and no CVA tenderness.       Bright red blood appreciated around G-tube. No arterial bleeding noted. Bled appears to be coming from an ulcerated region around the G-tube.  Musculoskeletal: Normal range of motion. He exhibits no edema and no tenderness.  Neurological: He is alert and oriented to person, place, and time. He has normal strength. No cranial nerve deficit or sensory deficit. GCS eye subscore is 4. GCS verbal subscore is 5. GCS motor subscore is  6.  Skin: Skin is warm and dry. No abrasion and no rash noted.  Psychiatric: He has a normal mood and affect. His speech is normal and behavior is normal.    ED Course  Procedures (including critical care time)   Labs Reviewed  CBC WITH DIFFERENTIAL  COMPREHENSIVE METABOLIC PANEL  SAMPLE TO BLOOD BANK   No results found.   No diagnosis found.    MDM  Patient's bleeding air was injected with 5 cc of 2% lidocaine with epinephrine. It was then packed with Gelfoam. He was observed for now and half and no bleeding appreciated now. He is to followup with his Dr. next week        Toy Baker, MD 07/13/12 2312

## 2012-07-15 ENCOUNTER — Telehealth (INDEPENDENT_AMBULATORY_CARE_PROVIDER_SITE_OTHER): Payer: Self-pay

## 2012-07-15 NOTE — Telephone Encounter (Signed)
Please call patients wife Eber Jones for follow up appointment for [facility]. He was seen over weekend for peg tube bleeding and needs follow up within 1-2 weeks.

## 2012-07-16 ENCOUNTER — Other Ambulatory Visit (HOSPITAL_COMMUNITY): Payer: Self-pay | Admitting: Interventional Radiology

## 2012-07-16 ENCOUNTER — Ambulatory Visit (HOSPITAL_COMMUNITY)
Admission: RE | Admit: 2012-07-16 | Discharge: 2012-07-16 | Disposition: A | Discharge status: Home / Self Care | Payer: Medicare Other | Source: Ambulatory Visit | Attending: Interventional Radiology | Admitting: Interventional Radiology

## 2012-07-16 DIAGNOSIS — Z431 Encounter for attention to gastrostomy: Secondary | ICD-10-CM | POA: Insufficient documentation

## 2012-07-16 DIAGNOSIS — Z931 Gastrostomy status: Secondary | ICD-10-CM

## 2012-07-16 MED ORDER — IOHEXOL 300 MG/ML  SOLN
50.0000 mL | Freq: Once | INTRAMUSCULAR | Status: AC | PRN
  Administered 2012-07-16: 10 mL via INTRAVENOUS

## 2012-07-16 NOTE — Procedures (Signed)
Procedure:  Gastrostomy replacement Findings:  New 24 Fr balloon retention G-tube placed through tract.  Tip in stomach.

## 2012-07-17 ENCOUNTER — Telehealth (HOSPITAL_COMMUNITY): Payer: Self-pay | Admitting: *Deleted

## 2012-07-25 ENCOUNTER — Telehealth (INDEPENDENT_AMBULATORY_CARE_PROVIDER_SITE_OTHER): Payer: Self-pay | Admitting: General Surgery

## 2012-07-25 ENCOUNTER — Encounter (INDEPENDENT_AMBULATORY_CARE_PROVIDER_SITE_OTHER): Payer: Self-pay | Admitting: General Surgery

## 2012-07-25 NOTE — Progress Notes (Signed)
Additional note to call made: Offered for patient to be seen in urgent office and it was denied by spouse. Will continue to call 911 as needed to take him to hospital.

## 2012-07-25 NOTE — Telephone Encounter (Signed)
Spoke with patient spouse and advised that Dr. Madilyn Fireman (GI) would be the one responsible for the upkeep and care of the GI tube, per discussion with Dr. Daphine Deutscher. He reviewed chart pulled from medical records and advised that Dr. Madilyn Fireman needs to maintain site for patient. [facility] only provides supplies for him. He does have a primary care (Dr Felipa Eth). She stated that when she cals the PCP they tell her to call Dr. Madilyn Fireman who tells them to call [facility]. However, per discussion with Dr. Daphine Deutscher this is a GI issue that Dr. Madilyn Fireman needs to address.Spouse said that they will continue to go directly to hospital as they have in the past.

## 2012-09-02 ENCOUNTER — Ambulatory Visit (HOSPITAL_COMMUNITY)
Admission: RE | Admit: 2012-09-02 | Discharge: 2012-09-02 | Disposition: A | Discharge status: Home / Self Care | Payer: Medicare Other | Source: Ambulatory Visit | Attending: Interventional Radiology | Admitting: Interventional Radiology

## 2012-09-02 ENCOUNTER — Other Ambulatory Visit (HOSPITAL_COMMUNITY): Payer: Self-pay | Admitting: Interventional Radiology

## 2012-09-02 DIAGNOSIS — R633 Feeding difficulties: Secondary | ICD-10-CM

## 2012-09-10 ENCOUNTER — Encounter (HOSPITAL_COMMUNITY): Payer: Self-pay | Admitting: Emergency Medicine

## 2012-09-10 ENCOUNTER — Encounter: Payer: Self-pay | Admitting: Nurse Practitioner

## 2012-09-10 ENCOUNTER — Inpatient Hospital Stay (HOSPITAL_COMMUNITY)
Admission: EM | Admit: 2012-09-10 | Discharge: 2012-09-14 | DRG: 377 | Disposition: A | Discharge status: Home Health | Payer: Medicare Other | Attending: Family Medicine | Admitting: Family Medicine

## 2012-09-10 ENCOUNTER — Ambulatory Visit (INDEPENDENT_AMBULATORY_CARE_PROVIDER_SITE_OTHER): Payer: Medicare Other | Admitting: Nurse Practitioner

## 2012-09-10 ENCOUNTER — Telehealth: Payer: Self-pay | Admitting: *Deleted

## 2012-09-10 VITALS — BP 110/56 | HR 80 | Ht 69.0 in | Wt 145.0 lb

## 2012-09-10 DIAGNOSIS — I509 Heart failure, unspecified: Secondary | ICD-10-CM | POA: Diagnosis present

## 2012-09-10 DIAGNOSIS — K922 Gastrointestinal hemorrhage, unspecified: Secondary | ICD-10-CM

## 2012-09-10 DIAGNOSIS — K264 Chronic or unspecified duodenal ulcer with hemorrhage: Principal | ICD-10-CM | POA: Diagnosis present

## 2012-09-10 DIAGNOSIS — D696 Thrombocytopenia, unspecified: Secondary | ICD-10-CM | POA: Diagnosis present

## 2012-09-10 DIAGNOSIS — I951 Orthostatic hypotension: Secondary | ICD-10-CM

## 2012-09-10 DIAGNOSIS — E785 Hyperlipidemia, unspecified: Secondary | ICD-10-CM | POA: Diagnosis present

## 2012-09-10 DIAGNOSIS — I5030 Unspecified diastolic (congestive) heart failure: Secondary | ICD-10-CM | POA: Diagnosis present

## 2012-09-10 DIAGNOSIS — I351 Nonrheumatic aortic (valve) insufficiency: Secondary | ICD-10-CM

## 2012-09-10 DIAGNOSIS — E46 Unspecified protein-calorie malnutrition: Secondary | ICD-10-CM | POA: Diagnosis present

## 2012-09-10 DIAGNOSIS — D62 Acute posthemorrhagic anemia: Secondary | ICD-10-CM | POA: Diagnosis present

## 2012-09-10 DIAGNOSIS — Z87891 Personal history of nicotine dependence: Secondary | ICD-10-CM

## 2012-09-10 DIAGNOSIS — K225 Diverticulum of esophagus, acquired: Secondary | ICD-10-CM | POA: Diagnosis present

## 2012-09-10 DIAGNOSIS — I129 Hypertensive chronic kidney disease with stage 1 through stage 4 chronic kidney disease, or unspecified chronic kidney disease: Secondary | ICD-10-CM | POA: Diagnosis present

## 2012-09-10 DIAGNOSIS — Z931 Gastrostomy status: Secondary | ICD-10-CM

## 2012-09-10 DIAGNOSIS — N183 Chronic kidney disease, stage 3 unspecified: Secondary | ICD-10-CM | POA: Diagnosis present

## 2012-09-10 DIAGNOSIS — K220 Achalasia of cardia: Secondary | ICD-10-CM | POA: Diagnosis present

## 2012-09-10 DIAGNOSIS — I10 Essential (primary) hypertension: Secondary | ICD-10-CM

## 2012-09-10 DIAGNOSIS — D649 Anemia, unspecified: Secondary | ICD-10-CM

## 2012-09-10 DIAGNOSIS — Q396 Congenital diverticulum of esophagus: Secondary | ICD-10-CM

## 2012-09-10 DIAGNOSIS — K259 Gastric ulcer, unspecified as acute or chronic, without hemorrhage or perforation: Secondary | ICD-10-CM

## 2012-09-10 DIAGNOSIS — G9340 Encephalopathy, unspecified: Secondary | ICD-10-CM | POA: Diagnosis not present

## 2012-09-10 DIAGNOSIS — I359 Nonrheumatic aortic valve disorder, unspecified: Secondary | ICD-10-CM | POA: Diagnosis present

## 2012-09-10 DIAGNOSIS — IMO0002 Reserved for concepts with insufficient information to code with codable children: Secondary | ICD-10-CM

## 2012-09-10 DIAGNOSIS — K9423 Gastrostomy malfunction: Secondary | ICD-10-CM

## 2012-09-10 DIAGNOSIS — I959 Hypotension, unspecified: Secondary | ICD-10-CM | POA: Diagnosis present

## 2012-09-10 HISTORY — DX: Gastric ulcer, unspecified as acute or chronic, without hemorrhage or perforation: K25.9

## 2012-09-10 HISTORY — DX: Congenital diverticulum of esophagus: Q39.6

## 2012-09-10 HISTORY — DX: Other complications of anesthesia, initial encounter: T88.59XA

## 2012-09-10 HISTORY — DX: Adverse effect of unspecified anesthetic, initial encounter: T41.45XA

## 2012-09-10 LAB — BASIC METABOLIC PANEL
BUN: 41 mg/dL — ABNORMAL HIGH (ref 6–23)
BUN: 41 mg/dL — ABNORMAL HIGH (ref 6–23)
CO2: 25 mEq/L (ref 19–32)
Calcium: 8.6 mg/dL (ref 8.4–10.5)
Calcium: 8.8 mg/dL (ref 8.4–10.5)
Chloride: 113 mEq/L — ABNORMAL HIGH (ref 96–112)
Creatinine, Ser: 0.8 mg/dL (ref 0.4–1.5)
Creatinine, Ser: 0.73 mg/dL (ref 0.50–1.35)
GFR calc non Af Amer: 85 mL/min — ABNORMAL LOW (ref >90)
GFR: 103.06 mL/min (ref >60.00)
Glucose, Bld: 89 mg/dL (ref 70–99)
Glucose, Bld: 93 mg/dL (ref 70–99)
Potassium: 3.9 mEq/L (ref 3.5–5.1)
Potassium: 4 mEq/L (ref 3.5–5.1)
Sodium: 144 mEq/L (ref 135–145)

## 2012-09-10 LAB — CBC WITH DIFFERENTIAL/PLATELET
Basophils Absolute: 0 10*3/uL (ref 0.0–0.1)
Basophils Relative: 0.4 % (ref 0.0–3.0)
Basophils Relative: 0 % (ref 0–1)
Eosinophils Absolute: 0 10*3/uL (ref 0.0–0.7)
Eosinophils Absolute: 0 10*3/uL (ref 0.0–0.7)
Eosinophils Relative: 0.4 % (ref 0.0–5.0)
HCT: 24.5 % — ABNORMAL LOW (ref 39.0–52.0)
Hemoglobin: 7.6 g/dL — CL (ref 13.0–17.0)
Hemoglobin: 7.4 g/dL — ABNORMAL LOW (ref 13.0–17.0)
Lymphocytes Relative: 15.1 % (ref 12.0–46.0)
Lymphs Abs: 0.9 10*3/uL (ref 0.7–4.0)
MCH: 31.9 pg (ref 26.0–34.0)
MCHC: 30.9 g/dL (ref 30.0–36.0)
MCHC: 29.8 g/dL — ABNORMAL LOW (ref 30.0–36.0)
MCV: 104.4 fl — ABNORMAL HIGH (ref 78.0–100.0)
Monocytes Absolute: 0.5 10*3/uL (ref 0.1–1.0)
Monocytes Relative: 8 % (ref 3.0–12.0)
Monocytes Relative: 8 % (ref 3–12)
Neutro Abs: 4.5 10*3/uL (ref 1.4–7.7)
Neutrophils Relative %: 76.1 % (ref 43.0–77.0)
Neutrophils Relative %: 72 % (ref 43–77)
Platelets: 168 10*3/uL (ref 150.0–400.0)
RBC: 2.35 Mil/uL — ABNORMAL LOW (ref 4.22–5.81)
RDW: 17.3 % — ABNORMAL HIGH (ref 11.5–14.6)
WBC: 5.9 10*3/uL (ref 4.5–10.5)

## 2012-09-10 LAB — MRSA PCR SCREENING: MRSA by PCR: NEGATIVE

## 2012-09-10 LAB — OCCULT BLOOD, POC DEVICE: Fecal Occult Bld: POSITIVE

## 2012-09-10 MED ORDER — IRBESARTAN 150 MG PO TABS: 75.0000 mg | ORAL_TABLET | Freq: Two times a day (BID) | ORAL | Status: DC

## 2012-09-10 MED ORDER — ACETAMINOPHEN 650 MG RE SUPP: 650.0000 mg | Freq: Four times a day (QID) | RECTAL | Status: DC | PRN

## 2012-09-10 MED ORDER — INFLUENZA VIRUS VACC SPLIT PF IM SUSP
0.5000 mL | INTRAMUSCULAR | Status: AC
  Administered 2012-09-11: 0.5 mL via INTRAMUSCULAR
  Filled 2012-09-10: qty 0.5

## 2012-09-10 MED ORDER — LORATADINE 10 MG PO TABS
10.0000 mg | ORAL_TABLET | Freq: Every day | ORAL | Status: DC
  Administered 2012-09-10: 10 mg via ORAL
  Filled 2012-09-10 (×3): qty 1

## 2012-09-10 MED ORDER — ONDANSETRON HCL 4 MG PO TABS: 4.0000 mg | ORAL_TABLET | Freq: Four times a day (QID) | ORAL | Status: DC | PRN

## 2012-09-10 MED ORDER — POLYETHYLENE GLYCOL 3350 17 G PO PACK
17.0000 g | PACK | Freq: Every day | ORAL | Status: DC | PRN
  Filled 2012-09-10: qty 1

## 2012-09-10 MED ORDER — ALUM & MAG HYDROXIDE-SIMETH 200-200-20 MG/5ML PO SUSP: 30.0000 mL | Freq: Four times a day (QID) | ORAL | Status: DC | PRN

## 2012-09-10 MED ORDER — ONDANSETRON HCL 4 MG/2ML IJ SOLN: 4.0000 mg | Freq: Four times a day (QID) | INTRAMUSCULAR | Status: DC | PRN

## 2012-09-10 MED ORDER — SODIUM CHLORIDE 0.9 % IV SOLN: INTRAVENOUS | Status: DC

## 2012-09-10 MED ORDER — FLEET ENEMA 7-19 GM/118ML RE ENEM: 1.0000 | ENEMA | Freq: Once | RECTAL | Status: AC | PRN

## 2012-09-10 MED ORDER — SODIUM CHLORIDE 0.9 % IJ SOLN
3.0000 mL | Freq: Two times a day (BID) | INTRAMUSCULAR | Status: DC
  Administered 2012-09-10 – 2012-09-13 (×3): 3 mL via INTRAVENOUS

## 2012-09-10 MED ORDER — SODIUM CHLORIDE 0.9 % IV SOLN
INTRAVENOUS | Status: DC
  Administered 2012-09-10: 75 mL/h via INTRAVENOUS
  Administered 2012-09-11: via INTRAVENOUS
  Administered 2012-09-11: 75 mL/h via INTRAVENOUS
  Administered 2012-09-12: 20:00:00 via INTRAVENOUS

## 2012-09-10 MED ORDER — ACETAMINOPHEN 325 MG PO TABS: 650.0000 mg | ORAL_TABLET | Freq: Four times a day (QID) | ORAL | Status: DC | PRN

## 2012-09-10 MED ORDER — PANTOPRAZOLE SODIUM 40 MG IV SOLR
40.0000 mg | Freq: Two times a day (BID) | INTRAVENOUS | Status: DC
  Administered 2012-09-10 – 2012-09-11 (×2): 40 mg via INTRAVENOUS
  Filled 2012-09-10 (×3): qty 40

## 2012-09-10 MED ORDER — SODIUM CHLORIDE 0.9 % IV BOLUS (SEPSIS)
500.0000 mL | Freq: Once | INTRAVENOUS | Status: AC
  Administered 2012-09-10: 500 mL via INTRAVENOUS

## 2012-09-10 MED ORDER — OXYCODONE HCL 5 MG PO TABS: 5.0000 mg | ORAL_TABLET | ORAL | Status: DC | PRN

## 2012-09-10 MED ORDER — ACETAMINOPHEN 500 MG PO TABS: 500.0000 mg | ORAL_TABLET | Freq: Four times a day (QID) | ORAL | Status: DC | PRN

## 2012-09-10 NOTE — ED Notes (Signed)
MD at bedside. 

## 2012-09-10 NOTE — Consult Note (Signed)
[facility] Consultation Note  Referring Provider: [facility] Primary Care Physician:  Hoyle Sauer, MD Primary Gastroenterologist:  Dr. Dorena Cookey  Reason for Consultation:  Anemia, dark stools  HPI: Corey [name] is a 78 y.o. male we've been asked to see for evaluation of anemia and dark stool.  For the past few weeks, he has had progressive fatigue and weakness.  He has had dark stool, but not black, since he was in the hospital in January, at which time I did endoscopy for blood in stool showing internal gastric wall ulcer in association with his internal PEG tube bumper.  He has no abdominal pain, change in bowel habits, melena, hematochezia, loss-of-appetite, unintentional weight loss.  Last colonoscopy on our office records was from 2008, showing sigmoid diverticulosis otherwise normal.   Past Medical History  Diagnosis Date  . Aortic insufficiency     severe per echo January 2013  . Achalasia, esophageal     s/p PEG tube  . Dysphagia   . Hypertension   . Encephalopathy     Post AAA surgery  . Hyperlipidemia   . Dementia   . Bruises easily   . PVD (peripheral vascular disease)   . AAA (abdominal aortic aneurysm)   . GI bleed January 2013    thru PEG tube with associated mild shock    Past Surgical History  Procedure Date  . Cardiac catheterization 09/19/2005    NORMAL. EF 65%  . Abdominal aortic aneurysm repair   . Transurethral resection of prostate   . Hernia repair   . Peg placement 2012  . Esophagogastroduodenoscopy 12/23/2011    Procedure: ESOPHAGOGASTRODUODENOSCOPY (EGD);  Surgeon: [name], MD;  Location: [facility];  Service: Endoscopy;  Laterality: N/A;    Prior to Admission medications   Medication Sig Start Date End Date Taking? Authorizing Provider  acetaminophen (TYLENOL) 500 MG tablet Take 500 mg by mouth every 6 (six) hours as needed. pain   Yes Historical Provider, MD  aspirin 81 MG tablet Take 1 tablet (81 mg total) by  mouth daily. 01/08/12  Yes Ravisankar R Avva, MD  irbesartan (AVAPRO) 150 MG tablet Take 0.5 tablets (75 mg total) by mouth 2 (two) times daily. 09/10/12  Yes Rosalio Macadamia, NP    Current Facility-Administered Medications  Medication Dose Route Frequency Provider Last Rate Last Dose  . sodium chloride 0.9 % bolus 500 mL  500 mL Intravenous Once Ward Givens, MD   500 mL at 09/10/12 1606   Current Outpatient Prescriptions  Medication Sig Dispense Refill  . acetaminophen (TYLENOL) 500 MG tablet Take 500 mg by mouth every 6 (six) hours as needed. pain      . aspirin 81 MG tablet Take 1 tablet (81 mg total) by mouth daily.  30 tablet  12  . irbesartan (AVAPRO) 150 MG tablet Take 0.5 tablets (75 mg total) by mouth 2 (two) times daily.  180 tablet  3  . DISCONTD: irbesartan (AVAPRO) 150 MG tablet Take 1 tablet (150 mg total) by mouth 2 (two) times daily.  180 tablet  3    Allergies as of 09/10/2012 - Review Complete 09/10/2012  Allergen Reaction Noted  . Iohexol Hives 10/11/2003    Family History  Problem Relation Age of Onset  . Aneurysm Mother   . Heart disease Mother   . Stomach cancer Father   . Cancer Father     colon  . Cancer Brother     lung  . Heart  disease Sister     History   Social History  . Marital Status: Married    Spouse Name: N/A    Number of Children: N/A  . Years of Education: N/A   Occupational History  . Not on file.   Social History Main Topics  . Smoking status: Former Smoker    Quit date: 12/11/1982  . Smokeless tobacco: Never Used  . Alcohol Use: No  . Drug Use: No  . Sexually Active: No   Other Topics Concern  . Not on file   Social History Narrative  . No narrative on file    Review of Systems: As per HPI, all others negative.  Physical Exam: Vital signs in last 24 hours: Temp:  [97.9 F (36.6 C)-98.7 F (37.1 C)] 98.7 F (37.1 C) (10/01 1655) Pulse Rate:  [80-87] 87  (10/01 1519) Resp:  [18] 18  (10/01 1655) BP:  (100-128)/(48-56) 100/48 mmHg (10/01 1519) SpO2:  [99 %-100 %] 99 % (10/01 1519) Weight:  [65.772 kg (145 lb)] 65.772 kg (145 lb) (10/01 0909)   General:   Alert,  Well-developed, well-nourished, pleasant and cooperative in NAD Head:  Normocephalic and atraumatic. Eyes:  Sclera clear, no icterus.   Conjunctiva pale. Ears:  Normal auditory acuity. Nose:  No deformity, discharge,  or lesions. Mouth:  No deformity or lesions.  Oropharynx pink & somewhat pale. Neck:  Supple; no masses or thyromegaly. Lungs:  Clear throughout to auscultation.   No wheezes, crackles, or rhonchi. No acute distress. Heart:  Regular rate and rhythm; no murmurs, clicks, rubs,  or gallops. Abdomen:  Soft, nontender and nondistended. PEG tube left upper midline; No masses, hepatosplenomegaly or hernias noted. Normal bowel sounds, without guarding, and without rebound.     Msk:  Symmetrical without gross deformities. Normal posture. Pulses:  Normal pulses noted. Extremities:  Without clubbing or edema. Neurologic:  Alert and  oriented x4;  Diffusely weak, otherwise grossly normal neurologically. Skin:  Intact without significant lesions or rashes. Psych:  Alert and cooperative. Normal mood and affect.   Lab Results:  Basename 09/10/12 1410 09/10/12 0947  WBC 5.4 5.9  HGB 7.4* 7.6 Repeated and verified X2.*  HCT 24.8* 24.5 Repeated and verified X2.*  PLT 182 168.0   BMET  Basename 09/10/12 1410 09/10/12 0947  NA 144 144  K 4.0 3.9  CL 111 113*  CO2 25 25  GLUCOSE 93 89  BUN 41* 41*  CREATININE 0.73 0.8  CALCIUM 8.8 8.6   LFT No results found for this basename: PROT,ALBUMIN,AST,ALT,ALKPHOS,BILITOT,BILIDIR,IBILI in the last 72 hours PT/INR No results found for this basename: LABPROT:2,INR:2 in the last 72 hours  Studies/Results: No results found.  Impression:  1.  Anemia. 2.  Dark stool. 3.  Feeding difficulties in setting of distal esophageal diverticulum, with bulk of nutrition via PEG. 4.   History of PEG-associated gastric ulcer.  Plan:  1.  PPI therapy. 2.  Endoscopy tomorrow early afternoon for further evaluation; hold PEG feeds after 0800 tomorrow. 3.  If endoscopy is unrevealing, consider colonoscopy. 4.  Will follow; thank you for the consult.   LOS: 0 days   Edin Kon M  09/10/2012, 5:11 PM

## 2012-09-10 NOTE — Progress Notes (Signed)
Corey [name], is a 78 y.o. male,   MRN: [id number]  -  DOB - [birth year]  Outpatient Primary MD for the patient is Hoyle Sauer, MD  in for    Chief Complaint  Patient presents with  . Low Hemoglobin      Blood pressure 100/48, pulse 87, temperature 97.9 F (36.6 C), temperature source Oral, resp. rate 18, SpO2 99.00%.  Principal Problem:  *Anemia Active Problems:  AI (aortic insufficiency)  HTN (hypertension)  Achalasia, esophageal  Encephalopathy  GI bleed   Pt states he went to his PCP this morning for feeling tired and weak lately, they did blood work and called him and told him to come to ED for blood transfusion d/t HGB 7. "something". Pt states he has been bleeding from his PEG tube, bright red blood last night, also bowel movements have been a darker brown than normal. Denies pain, n/v/d. No bleeding noted to PEG tube at this time.  Work up yield Hg 7.4 BUN 41. FOBT +  VSS with HR 87 SBP range 100-110. Given 500cc fluids and blood ordered. Dr Dulce Sellar notified.   Will admit to SD.

## 2012-09-10 NOTE — ED Notes (Signed)
Attempt x1 to call report to 2W. States RN will call back in just a few minutes.

## 2012-09-10 NOTE — ED Notes (Signed)
Pt states he went to his PCP this morning for feeling tired and weak lately, they did blood work and called him and told him to come to ED for blood transfusion d/t HGB 7. "something". Pt states he has been bleeding from his PEG tube, bright red blood last night, also bowel movements have been a darker brown than normal. Denies pain, n/v/d. No bleeding noted to PEG tube at this time.

## 2012-09-10 NOTE — ED Notes (Signed)
Pt states that his doctor called him at home and told him to come up to the ER for a low hemoglobin ("7 point something").  Pt has a peg tube and has been bleeding from the site.  Wife states that blood was bright red but was not a whole lot of blood.  Pt states that he has felt very fatigued.  Pt reports "dark brown stools".  "Darker than they normally are."

## 2012-09-10 NOTE — H&P (Signed)
[facility] History and Physical  MARTY UY QMV:[id number] DOB: 05/21/1929 DOA: 09/10/2012  Referring physician: Dr. Freddrick March PCP: Hoyle Sauer, MD   Chief Complaint: Low blood counts  HPI: Corey [name] is a 78 y.o. male , pleasant with a history of severe insufficiency per 2-D echo January 2013, history of achalasia and large distal esophageal diverticulum status post PEG tube placement, history of PEG associated gastric ulcer, hypertension, AAA status post repair, hyperlipidemia, peripheral vascular disease who presents to the ED with a 3 to four-week history of progressive fatigue and generalized weakness and dyspnea on exertion. Patient's wife states that patient has also been having some borderline hypotension with systolic blood pressure in the 90s and has had some bleeding around his PEG site one day prior to admission. Patient denies any melanotic stools, no hematemesis. Patient's wife stated that he had some bleeding around the PEG tube in January of this past year. Patient had presented to see his cardiologist for routine followup and had complaints of dyspnea on exertion and generalized fatigue. Labs were obtained. Patient wife also noted to cardiology that he's had systolic blood pressures in the 90s and as such is antihypertensive medication dose was decreased. Patient's cardiologist's office called the patient as well as his PCPs office with abnormal labs with a low hemoglobin in the sevens and patient was told to present to the ED. Patient was seen in the ED CBC obtained had a hemoglobin of 7.4. 2 units of packed red blood cells have been ordered, and gastroenterology was consulted on the patient. We were caled to admit the patient for further evaluation and management.  Review of Systems: The patient denies anorexia, fever, weight loss,, vision loss, decreased hearing, hoarseness, chest pain, syncope, dyspnea on exertion, peripheral edema, balance deficits, hemoptysis, abdominal  pain, melena, hematochezia, severe indigestion/heartburn, hematuria, incontinence, genital sores, muscle weakness, suspicious skin lesions, transient blindness, difficulty walking, depression, unusual weight change, abnormal bleeding, enlarged lymph nodes, angioedema, and breast masses.   Past Medical History  Diagnosis Date  . Aortic insufficiency     severe per echo January 2013  . Achalasia, esophageal     s/p PEG tube  . Dysphagia   . Hypertension   . Encephalopathy     Post AAA surgery  . Hyperlipidemia   . Bruises easily   . PVD (peripheral vascular disease)   . AAA (abdominal aortic aneurysm)   . GI bleed January 2013    thru PEG tube with associated mild shock  . Complication of anesthesia     confusion at times  . Esophageal diverticulum 09/10/2012    Large distal esophageal diverticulum leading to feeding difficulties.  . Gastric ulcer 09/10/2012    Hx of PEG associated gastric ulcer   Past Surgical History  Procedure Date  . Cardiac catheterization 09/19/2005    NORMAL. EF 65%  . Abdominal aortic aneurysm repair   . Transurethral resection of prostate   . Hernia repair   . Peg placement 2012  . Esophagogastroduodenoscopy 12/23/2011    Procedure: ESOPHAGOGASTRODUODENOSCOPY (EGD);  Surgeon: [name], MD;  Location: [facility];  Service: Endoscopy;  Laterality: N/A;   Social History:  reports that he quit smoking about 29 years ago. He has never used smokeless tobacco. He reports that he does not drink alcohol or use illicit drugs.  Allergies  Allergen Reactions  . Iohexol Hives    Family History  Problem Relation Age of Onset  . Aneurysm Mother   .  Heart disease Mother   . Stomach cancer Father   . Cancer Father     colon  . Cancer Brother     lung  . Heart disease Sister     Prior to Admission medications   Medication Sig Start Date End Date Taking? Authorizing Provider  acetaminophen (TYLENOL) 500 MG tablet Take 500 mg by mouth every 6 (six)  hours as needed. pain   Yes Historical Provider, MD  aspirin 81 MG tablet Take 1 tablet (81 mg total) by mouth daily. 01/08/12  Yes Ravisankar R Avva, MD  irbesartan (AVAPRO) 150 MG tablet Take 0.5 tablets (75 mg total) by mouth 2 (two) times daily. 09/10/12  Yes Rosalio Macadamia, NP   Physical Exam: Filed Vitals:   09/10/12 1332 09/10/12 1519 09/10/12 1655 09/10/12 1715  BP: 128/48 100/48  130/51  Pulse: 83 87  87  Temp: 97.9 F (36.6 C)  98.7 F (37.1 C) 98.6 F (37 C)  TempSrc: Oral  Oral Oral  Resp: 18 18 18 18   SpO2: 100% 99%       General:  Well-developed well-nourished in no acute cardiopulmonary distress.  Eyes: Pupils equal round and reactive to light and accommodation. Extraocular movements intact.  ENT: Oropharynx is clear, no lesions, no exudates. Dry mucous membranes.    Neck: Supple with no lymphadenopathy. No JVD.  Cardiovascular: Regular rate rhythm no murmurs rubs or gallops  Respiratory: Clear to auscultation bilaterally. No wheezes no crackles no rhonchi  Abdomen: Soft/nontender/nondistended/positive bowel sounds. PEG tube left upper midline.  Skin: No rashes or lesions noted  Musculoskeletal: 5 out of 5 bilateral upper extremity strength 5 of 5 bilateral lower extremity strength  Psychiatric: Normal mood normal affect  Neurologic: Alert and oriented x3. Cranial nerves II through XII are grossly intact. No focal deficits.  Labs on Admission:  Basic Metabolic Panel:  Lab 09/10/12 1610 09/10/12 0947  NA 144 144  K 4.0 3.9  CL 111 113*  CO2 25 25  GLUCOSE 93 89  BUN 41* 41*  CREATININE 0.73 0.8  CALCIUM 8.8 8.6  MG -- --  PHOS -- --   Liver Function Tests: No results found for this basename: AST:5,ALT:5,ALKPHOS:5,BILITOT:5,PROT:5,ALBUMIN:5 in the last 168 hours No results found for this basename: LIPASE:5,AMYLASE:5 in the last 168 hours No results found for this basename: AMMONIA:5 in the last 168 hours CBC:  Lab 09/10/12 1410 09/10/12 0947    WBC 5.4 5.9  NEUTROABS 3.9 4.5  HGB 7.4* 7.6 Repeated and verified X2.*  HCT 24.8* 24.5 Repeated and verified X2.*  MCV 106.9* 104.4*  PLT 182 168.0   Cardiac Enzymes: No results found for this basename: CKTOTAL:5,CKMB:5,CKMBINDEX:5,TROPONINI:5 in the last 168 hours  BNP (last 3 results)  Basename 01/05/12 1015  PROBNP 150.0*   CBG: No results found for this basename: GLUCAP:5 in the last 168 hours  Radiological Exams on Admission: No results found.  EKG: None  Assessment/Plan Principal Problem:  *Anemia Active Problems:  AI (aortic insufficiency)  HTN (hypertension)  Hyperlipidemia  Hypotension  Achalasia, esophageal  Encephalopathy  GI bleed  Esophageal diverticulum  Gastric ulcer  #1 symptomatic anemia Patient admitted with a hemoglobin of 7.4 the patient's hemoglobin was 11 on 07/13/2012. Patient's wife does endorse an episode of bright red blood around the PEG site the night prior to admission. Patient also with a prior history of PEG associated gastric ulcer. Patient also complains of progressive generalized fatigue weakness and dyspnea on exertion. Will admit the patient to  step down unit as patient has systolic blood pressure initially in the 90s. Will place patient on a PPI. Will make patient n.p.o. after 8 AM. Gastroenterology has seen patient in the endoscopy has been scheduled for tomorrow. Transfused 2 units of packed red blood cells. Supportive care. Follow.  #2 feeding difficulties in the setting of distal esophageal diverticulum with bulk nutrition via PEG Will keep patient n.p.o. until endoscopy is done. We'll resume tube feeds when okay by gastroenterology.  #3 hypertension Will blood pressure medications for now and follow.  #4 aortic insufficiency Will hold blood pressure medications for now secondary to problem #1. Monitor closely with hydration.  #5 severe achalasia with large distal esophageal diverticulum status post PEG Will resume PEG  feedings as per GI recommendations.  #6 prophylaxis PPI for GI prophylaxis. SCDs for DVT prophylaxis.  Code Status: Full Family Communication: Updated patient and wife at bedside Disposition Plan: Admit to SDU  Time spent: 76 MINS  [facility] Pager [phone]  If 7PM-7AM, please contact night-coverage [email or web address] Password [email or web address] 09/10/2012, 6:07 PM

## 2012-09-10 NOTE — ED Provider Notes (Signed)
History     CSN: [id number]  Arrival date & time 09/10/12  1311   First MD Initiated Contact with Patient 09/10/12 1501      Chief Complaint  Patient presents with  . Low Hemoglobin     (Consider location/radiation/quality/duration/timing/severity/associated sxs/prior treatment) HPI  Pt has had a PEG for the past 18 months and per wife he had major bleeding internally from an friction ulcer in January that was noted on EDG by Dr Dulce Sellar (states his usual GI is Dr Madilyn Fireman).  THey also report a chronic yeast infection around the tube "forever, since it was put in place" and was seen last week by the PA in radiology which they report really helped.  They also report he has a leaking aneurysm and I think upon viewing his chart they mean he has Aortic Insufficiency and has also had  AAA repair. Wife states he has started to get weak and run down with DOE and they called Dr Alford Highland office and were seen today and were called that he was anemic and told to come to the ED. Pt denies abdominal pain, chest pain, SOB but does have DOE.  They report his stools have been dark brown, but not black. Wife states last night he had some bright red bleeding from around his PEG tube, but not coming out of the PEG tube. Pt has the PEG b/o achalasia/and diverticulum of the stomach.   PCP Dr Felipa Eth GI Dr Madilyn Fireman Cardiology Dr [name]  Past Medical History  Diagnosis Date  . Aortic insufficiency     severe per echo January 2013  . Achalasia, esophageal     s/p PEG tube  . Dysphagia   . Hypertension   . Encephalopathy     Post AAA surgery  . Hyperlipidemia   . Dementia   . Bruises easily   . PVD (peripheral vascular disease)   . AAA (abdominal aortic aneurysm)   . GI bleed January 2013    thru PEG tube with associated mild shock    Past Surgical History  Procedure Date  . Cardiac catheterization 09/19/2005    NORMAL. EF 65%  . Abdominal aortic aneurysm repair   . Transurethral resection of prostate     . Hernia repair   . Peg placement 2012  . Esophagogastroduodenoscopy 12/23/2011    Procedure: ESOPHAGOGASTRODUODENOSCOPY (EGD);  Surgeon: [name], MD;  Location: [facility];  Service: Endoscopy;  Laterality: N/A;    Family History  Problem Relation Age of Onset  . Aneurysm Mother   . Heart disease Mother   . Stomach cancer Father   . Cancer Father     colon  . Cancer Brother     lung  . Heart disease Sister     History  Substance Use Topics  . Smoking status: Former Smoker    Quit date: 12/11/1982  . Smokeless tobacco: Never Used  . Alcohol Use: No   Lives at home Lives with spouse    Review of Systems  All other systems reviewed and are negative.    Allergies  Iohexol  Home Medications   Current Outpatient Rx  Name Route Sig Dispense Refill  . ACETAMINOPHEN 500 MG PO TABS Oral Take 500 mg by mouth every 6 (six) hours as needed. pain    . ASPIRIN 81 MG PO TABS Oral Take 1 tablet (81 mg total) by mouth daily. 30 tablet 12    Start 2 weeks after discharge!!!!  . IRBESARTAN 150 MG PO  TABS Oral Take 0.5 tablets (75 mg total) by mouth 2 (two) times daily. 180 tablet 3    BP 100/48  Pulse 87  Temp 97.9 F (36.6 C) (Oral)  Resp 18  SpO2 99%  Vital signs normal    Physical Exam  Nursing note and vitals reviewed. Constitutional: He is oriented to person, place, and time. He appears well-developed and well-nourished.  Non-toxic appearance. He does not appear ill. No distress.  HENT:  Head: Normocephalic and atraumatic.  Right Ear: External ear normal.  Left Ear: External ear normal.  Nose: Nose normal. No mucosal edema or rhinorrhea.  Mouth/Throat: Oropharynx is clear and moist and mucous membranes are normal. No dental abscesses or uvula swelling.  Eyes: Conjunctivae normal and EOM are normal. Pupils are equal, round, and reactive to light.  Neck: Normal range of motion and full passive range of motion without pain. Neck supple.  Cardiovascular:  Normal rate and regular rhythm.  Exam reveals no gallop and no friction rub.   Murmur heard.  Systolic murmur is present  Pulmonary/Chest: Effort normal and breath sounds normal. No respiratory distress. He has no wheezes. He has no rhonchi. He has no rales. He exhibits no tenderness and no crepitus.  Abdominal: Soft. Normal appearance and bowel sounds are normal. He exhibits no distension. There is no tenderness. There is no rebound and no guarding.       The dressing around the PEG tube is clean without drainage or bloody drainage. The skin around the PEG insertion does not appear macerated.  Genitourinary:       Old hemorrhoid tag, stool dark brown  Musculoskeletal: Normal range of motion. He exhibits no edema and no tenderness.       Moves all extremities well.   Neurological: He is alert and oriented to person, place, and time. He has normal strength. No cranial nerve deficit.  Skin: Skin is warm, dry and intact. No rash noted. No erythema. No pallor.  Psychiatric: He has a normal mood and affect. His speech is normal and behavior is normal. His mood appears not anxious.    ED Course  Procedures (including critical care time)   Medications  sodium chloride 0.9 % bolus 500 mL (500 mL Intravenous Given 09/10/12 1606)   Patient prepared for a transfusion of PRC's.    15:38 Dr Dulce Sellar will do EDG in am.  16:18 Toya Smothers, NP accepts for admission to step down, Dr Rito Ehrlich, team 8  Results for orders placed during the hospital encounter of 09/10/12  BASIC METABOLIC PANEL      Component Value Range   Sodium 144  135 - 145 mEq/L   Potassium 4.0  3.5 - 5.1 mEq/L   Chloride 111  96 - 112 mEq/L   CO2 25  19 - 32 mEq/L   Glucose, Bld 93  70 - 99 mg/dL   BUN 41 (*) 6 - 23 mg/dL   Creatinine, Ser 1.61  0.50 - 1.35 mg/dL   Calcium 8.8  8.4 - 09.6 mg/dL   GFR calc non Af Amer 85 (*) >90 mL/min   GFR calc Af Amer >90  >90 mL/min  CBC WITH DIFFERENTIAL      Component Value Range   WBC  5.4  4.0 - 10.5 K/uL   RBC 2.32 (*) 4.22 - 5.81 MIL/uL   Hemoglobin 7.4 (*) 13.0 - 17.0 g/dL   HCT 04.5 (*) 40.9 - 81.1 %   MCV 106.9 (*) 78.0 - 100.0 fL  MCH 31.9  26.0 - 34.0 pg   MCHC 29.8 (*) 30.0 - 36.0 g/dL   RDW 16.1 (*) 09.6 - 04.5 %   Platelets 182  150 - 400 K/uL   Neutrophils Relative 72  43 - 77 %   Neutro Abs 3.9  1.7 - 7.7 K/uL   Lymphocytes Relative 19  12 - 46 %   Lymphs Abs 1.0  0.7 - 4.0 K/uL   Monocytes Relative 8  3 - 12 %   Monocytes Absolute 0.4  0.1 - 1.0 K/uL   Eosinophils Relative 1  0 - 5 %   Eosinophils Absolute 0.0  0.0 - 0.7 K/uL   Basophils Relative 0  0 - 1 %   Basophils Absolute 0.0  0.0 - 0.1 K/uL  TYPE AND SCREEN      Component Value Range   ABO/RH(D) O POS     Antibody Screen NEG     Sample Expiration 09/13/2012     Unit Number [id number]     Blood Component Type RED CELLS,LR     Unit division 00     Status of Unit ALLOCATED     Transfusion Status OK TO TRANSFUSE     Crossmatch Result Compatible     Unit Number [id number]     Blood Component Type RBC LR PHER1     Unit division 00     Status of Unit ALLOCATED     Transfusion Status OK TO TRANSFUSE     Crossmatch Result Compatible    PREPARE RBC (CROSSMATCH)      Component Value Range   Order Confirmation ORDER PROCESSED BY BLOOD BANK    OCCULT BLOOD, POC DEVICE      Component Value Range   Fecal Occult Bld POSITIVE     Laboratory interpretation all normal except , positive Hemoccult   Ir Radiologist Eval & Mgmt  09/03/2012  *RADIOLOGY REPORT*  Clinical Data: Problems with gastric tube.  ESTABLISHED PATIENT OFFICE VISIT:  Level II [id number]  History of present illness:  Mr. Myler presents today for reevaluation of his gastrostomy tube.  He was last seen on 07/16/2012 when a 24-French balloon retention gastric tube was exchanged due to the initial one falling out of place.  The patient had been seen also for redness at maceration of the skin surrounding the tube and was placed on  Nystatin powder and oral diflucan with initial improvement.  The patient continues to have trouble with leakage and skin irritation around the tube now that this regimen has been completed.  Exam: The gastric tube is intact and is flushed easily with out any pain to the patient or evidence of leakage around the insertion site.  The bumper was noted to be approximately 3 cm away from the skin allowing for gastric drainage to continue to leak which is evident upon exam.  The area circumferentially is red, macerated and tender. His stomach is soft and nondistended with positive bowel sounds. Unfortunately the gastric tube was placed immediately under the lower portion of the patient's rib cage and appears to be within a skin crease of the patient's abdomen promoting a moisturous environment.  Patient and spouse informed this is going to be a chronic ongoing issue we need to get a handle on.  Assessment plan:  Prior treatment regimen was reviewed with the patient and his spouse at today's setting.  The spouse who takes care of the gastric tube was informed that the bumper must remain as close to  the skin as possible to prevent further gastric juice leakage which continues to be a problem promoting skin maceration. The use of hydrogen peroxide to cleanse the skin as well as the bumper is to be used approximately twice a day to keep the area clean and promote dryness.  She is to use Nystatin powder in the morning along with a topical antibiotic and should remain covered with a small amount of gauze throughout the day and changed as necessary to keep skin dry.  In the evenings to cleanse again with hydrogen peroxide and to apply Maalox to the skin which will allow a skin protective barrier. She was also informed that the patient should allow out this area to air out as much as possible when he is at home and sitting around and to keep loosely the gauze fixed under the bumper. Written instructions were provided to her to  make sure she understood the plan. They are to contact us within the next week to let us know if there been any benefits with this regimen or worsening of symptoms.  Total time spent with the patient was approximately 20 minutes.  Read by: Anselm Pancoast, P.A.-C   Original Report Authenticated By: Waynard Reeds, M.D.     1. Anemia    Plan admission  Devoria Albe, MD, FACEP    MDM  patient has symptomatic anemia and history of bleeding from around his PEG tube last night. He has had an friction ulcer with significant bleeding in January. He is being admitted for blood transfusion and Dr. Dulce Sellar will do endoscopy in the morning to see if he has another friction ulcer.      CRITICAL CARE Performed by: Devoria Albe L   Total critical care time: 31 minutes Critical care time was exclusive of separately billable procedures and treating other patients.  Critical care was necessary to treat or prevent imminent or life-threatening deterioration.  Critical care was time spent personally by me on the following activities: development of treatment plan with patient and/or surrogate as well as nursing, discussions with consultants, evaluation of patient's response to treatment, examination of patient, obtaining history from patient or surrogate, ordering and performing treatments and interventions, ordering and review of laboratory studies, ordering and review of radiographic studies, pulse oximetry and re-evaluation of patient's condition.      Ward Givens, MD 09/10/12 [phone]

## 2012-09-10 NOTE — Patient Instructions (Addendum)
Cut the Avapro in half to 75 mg two times a day  We need to check some labs today  I will see you in 3 months  Call the [facility] office at [phone] if you have any questions, problems or concerns.

## 2012-09-10 NOTE — Progress Notes (Addendum)
Corey [name] Date of Birth: 1929/04/25 Medical Record #[id number]  History of Present Illness: Corey [name] is seen back today for a 4 month check. He is seen for Dr. [name]. He has severe AI. Still with normal LV function per echo earlier this year. Other issues include an indwelling PEG due to achalasia. Has had a remote AAA that left him encephalopathic but he did recover. He was hospitalized for 43 days. He has generally not done well with any hospital stay due to neurologic changes.  He comes in today. He is here with his wife Corey [name]. They are both quite frustrated. They have continued to have problems with the PEG tube. It has been bleeding. They go to Radiology later today because of the bleeding. It is still constantly draining. Their quality of life is certainly suffering and they both admit that they are quite frustrated with the medical community. No one seems to want to take responsibility for the PEG. He is here in our office today because BP has been dropping. She notes more readings in the 90's. He is fatigued. Not very active. No chest pain.   Current Outpatient Prescriptions on File Prior to Visit  Medication Sig Dispense Refill  . aspirin 81 MG tablet Take 1 tablet (81 mg total) by mouth daily.  30 tablet  12  . loratadine (CLARITIN) 10 MG tablet Take 10 mg by mouth daily.      . Nutritional Supplements (TWOCAL HN) LIQD Place 1,185 mLs (5 Cans total) into feeding tube 4 (four) times daily.  120 Can  12  . DISCONTD: irbesartan (AVAPRO) 150 MG tablet Take 1 tablet (150 mg total) by mouth 2 (two) times daily.  180 tablet  3    Allergies  Allergen Reactions  . Iohexol Hives    Past Medical History  Diagnosis Date  . Aortic insufficiency     severe per echo January 2013  . Achalasia, esophageal     s/p PEG tube  . Dysphagia   . Hypertension   . Encephalopathy     Post AAA surgery  . Hyperlipidemia   . Dementia   . Bruises easily   . PVD (peripheral vascular disease)   . AAA  (abdominal aortic aneurysm)   . GI bleed January 2013    thru PEG tube with associated mild shock    Past Surgical History  Procedure Date  . Cardiac catheterization 09/19/2005    NORMAL. EF 65%  . Abdominal aortic aneurysm repair   . Transurethral resection of prostate   . Hernia repair   . Peg placement 2012  . Esophagogastroduodenoscopy 12/23/2011    Procedure: ESOPHAGOGASTRODUODENOSCOPY (EGD);  Surgeon: [name], MD;  Location: [facility];  Service: Endoscopy;  Laterality: N/A;    History  Smoking status  . Former Smoker  . Quit date: 12/11/1982  Smokeless tobacco  . Never Used    History  Alcohol Use No    Family History  Problem Relation Age of Onset  . Aneurysm Mother   . Heart disease Mother   . Stomach cancer Father   . Cancer Father     colon  . Cancer Brother     lung  . Heart disease Sister     Review of Systems: The review of systems is per the HPI.  All other systems were reviewed and are negative.  Physical Exam: BP 110/56  Pulse 80  Ht 5\' 9"  (1.753 m)  Wt 145 lb (65.772 kg)  BMI 21.41  kg/m2 Patient is very pleasant and in no acute distress. Skin is warm and dry. Color is quite pale today.  HEENT is unremarkable. Normocephalic/atraumatic. PERRL. Sclera are nonicteric. Neck is supple. No masses. No JVD. Lungs are clear. Cardiac exam shows a regular rate and rhythm. He has a loud murmur of AI noted. Abdomen is soft. PEG is in place and fresh blood noted on the dressings. Extremities are without edema. Gait and ROM are intact. No gross neurologic deficits noted.   LABORATORY DATA: PENDING  Lab Results  Component Value Date   WBC 5.5 07/13/2012   HGB 11.0* 07/13/2012   HCT 32.6* 07/13/2012   PLT 129* 07/13/2012   GLUCOSE 101* 07/13/2012   CHOL 180 05/07/2012   TRIG 130.0 05/07/2012   HDL 55.00 05/07/2012   LDLCALC 99 05/07/2012   ALT 20 07/13/2012   AST 24 07/13/2012   NA 143 07/13/2012   K 4.0 07/13/2012   CL 109 07/13/2012   CREATININE 0.69  07/13/2012   BUN 52* 07/13/2012   CO2 27 07/13/2012   INR 1.17 12/23/2011    Assessment / Plan: 1. Symptomatic hypotension - I have cut the Avapro in half. His wife will continue to monitor at home. Will tentatively see him in 3 months. They will call if his BP remains low. We are rechecking labs today as well.   2. Severe AI - he has opted to not have surgical intervention.   3. Severe achalasia with PEG in place. They have had issues with the PEG every since it was placed with recurrent bleeding, draining, infection etc. They are both quite frustrated. I have encouraged them to discuss their concerns with Dr. Madilyn Fireman. They are interested in referral to [facility] and they do have ties to the [facility] community.   Patient is agreeable to this plan and will call if any problems develop in the interim.   Addendum: Lab has called. Hemoglobin was 7.4. I have spoken to [facility]. Will send to ER for transfusion.

## 2012-09-10 NOTE — ED Notes (Signed)
Attempt x2 to call report. RN states she will call back in 5 minutes, she is in charge as well as taking assignment.

## 2012-09-10 NOTE — ED Notes (Signed)
Attempt x3 to start 2nd IV site, unsuccessful attempts by this RN and Rolly Salter, Charity fundraiser

## 2012-09-10 NOTE — Telephone Encounter (Signed)
The [facility] lab report a Hemoglobin on pt of 7.6 Hematocrit of 24. Corey Fredrickson NP recommended for pt to go to the ER for blood transfusion. Corey [name] spoke with Dr. Felipa Eth PCP  CMA to see if PCP's associate Dr. Neale Burly to let him know that he will  be called when pt arrives to the ER, so pt can have a blood transfusion today. Patient's wife  aware, she will take pt to [facility] ER.

## 2012-09-11 ENCOUNTER — Encounter (HOSPITAL_COMMUNITY)
Admission: EM | Disposition: A | Discharge status: Home Health | Payer: Self-pay | Source: Home / Self Care | Attending: Family Medicine

## 2012-09-11 ENCOUNTER — Encounter (HOSPITAL_COMMUNITY): Payer: Self-pay

## 2012-09-11 HISTORY — PX: ESOPHAGOGASTRODUODENOSCOPY: SHX5428

## 2012-09-11 LAB — CBC WITH DIFFERENTIAL/PLATELET
Basophils Absolute: 0 10*3/uL (ref 0.0–0.1)
Basophils Relative: 0 % (ref 0–1)
MCHC: 31.5 g/dL (ref 30.0–36.0)
Neutro Abs: 4.1 10*3/uL (ref 1.7–7.7)
Neutrophils Relative %: 74 % (ref 43–77)
Platelets: 156 10*3/uL (ref 150–400)
RDW: 19.6 % — ABNORMAL HIGH (ref 11.5–15.5)

## 2012-09-11 LAB — URINALYSIS, ROUTINE W REFLEX MICROSCOPIC
Bilirubin Urine: NEGATIVE
Glucose, UA: NEGATIVE mg/dL
Hgb urine dipstick: NEGATIVE
Protein, ur: NEGATIVE mg/dL
Urobilinogen, UA: 0.2 mg/dL (ref 0.0–1.0)

## 2012-09-11 LAB — TYPE AND SCREEN
Antibody Screen: NEGATIVE
Unit division: 0

## 2012-09-11 LAB — CBC
Hemoglobin: 7.8 g/dL — ABNORMAL LOW (ref 13.0–17.0)
MCHC: 31.8 g/dL (ref 30.0–36.0)
RDW: 19.9 % — ABNORMAL HIGH (ref 11.5–15.5)

## 2012-09-11 LAB — COMPREHENSIVE METABOLIC PANEL
ALT: 21 U/L (ref 0–53)
AST: 25 U/L (ref 0–37)
Albumin: 2.8 g/dL — ABNORMAL LOW (ref 3.5–5.2)
Alkaline Phosphatase: 103 U/L (ref 39–117)
Alkaline Phosphatase: 116 U/L (ref 39–117)
CO2: 22 mEq/L (ref 19–32)
Calcium: 8 mg/dL — ABNORMAL LOW (ref 8.4–10.5)
Chloride: 113 mEq/L — ABNORMAL HIGH (ref 96–112)
GFR calc Af Amer: >90 mL/min (ref >90)
GFR calc non Af Amer: 87 mL/min — ABNORMAL LOW (ref >90)
Glucose, Bld: 83 mg/dL (ref 70–99)
Potassium: 3.9 mEq/L (ref 3.5–5.1)
Sodium: 142 mEq/L (ref 135–145)
Total Bilirubin: 0.9 mg/dL (ref 0.3–1.2)

## 2012-09-11 LAB — URINE MICROSCOPIC-ADD ON

## 2012-09-11 SURGERY — EGD (ESOPHAGOGASTRODUODENOSCOPY)
Anesthesia: Moderate Sedation | Laterality: Left

## 2012-09-11 MED ORDER — SODIUM CHLORIDE 0.9 % IJ SOLN
INTRAMUSCULAR | Status: DC | PRN
  Administered 2012-09-11: 14:00:00

## 2012-09-11 MED ORDER — BUTAMBEN-TETRACAINE-BENZOCAINE 2-2-14 % EX AERO
INHALATION_SPRAY | CUTANEOUS | Status: DC | PRN
  Administered 2012-09-11: 1 via TOPICAL

## 2012-09-11 MED ORDER — OSMOLITE 1.5 CAL PO LIQD
237.0000 mL | ORAL | Status: DC
  Administered 2012-09-12: 237 mL
  Filled 2012-09-11 (×9): qty 237

## 2012-09-11 MED ORDER — FENTANYL CITRATE 0.05 MG/ML IJ SOLN
INTRAMUSCULAR | Status: AC
  Filled 2012-09-11: qty 2

## 2012-09-11 MED ORDER — SODIUM CHLORIDE 0.9 % IV SOLN
80.0000 mg | Freq: Once | INTRAVENOUS | Status: AC
  Administered 2012-09-11: 80 mg via INTRAVENOUS
  Filled 2012-09-11 (×2): qty 80

## 2012-09-11 MED ORDER — FENTANYL CITRATE 0.05 MG/ML IJ SOLN
INTRAMUSCULAR | Status: DC | PRN
  Administered 2012-09-11: 15 ug via INTRAVENOUS
  Administered 2012-09-11: 25 ug via INTRAVENOUS
  Administered 2012-09-11: 10 ug via INTRAVENOUS

## 2012-09-11 MED ORDER — MIDAZOLAM HCL 10 MG/2ML IJ SOLN
INTRAMUSCULAR | Status: DC | PRN
  Administered 2012-09-11 (×5): 1 mg via INTRAVENOUS

## 2012-09-11 MED ORDER — MIDAZOLAM HCL 10 MG/2ML IJ SOLN
INTRAMUSCULAR | Status: AC
  Filled 2012-09-11: qty 2

## 2012-09-11 MED ORDER — EPINEPHRINE HCL 0.1 MG/ML IJ SOLN
INTRAMUSCULAR | Status: AC
  Filled 2012-09-11: qty 10

## 2012-09-11 MED ORDER — SODIUM CHLORIDE 0.9 % IV SOLN
8.0000 mg/h | INTRAVENOUS | Status: DC
  Administered 2012-09-11 – 2012-09-13 (×4): 8 mg/h via INTRAVENOUS
  Filled 2012-09-11 (×12): qty 80

## 2012-09-11 NOTE — Evaluation (Signed)
Physical Therapy Evaluation Patient Details Name: Corey [name] MRN: [id number] DOB: 1929-12-01 Today's Date: 09/11/2012 Time: 4098-1191 PT Time Calculation (min): 20 min  PT Assessment / Plan / Recommendation Clinical Impression  Pt presents with anemia and s/p EGD with history of achalasia, large distal esophogeal diverticulum s/p PEG placement, AAA repair, and PVD.  Tolerated OOB and ambulation in hallway well, however noted very decreased stability when ambulating and lack of knowledge using RW, requirng assist to steer and negotiate and max cues for safety.  Pt will benefit from skilled PT in acute venue to address deficits.  PT recommends HHPT vs SNF pending pt progress.      PT Assessment  Patient needs continued PT services    Follow Up Recommendations  Home health PT;Post acute inpatient rehab    Barriers to Discharge None      Equipment Recommendations  Rolling walker with 5" wheels;Cane (TBD pending pt progress. )    Recommendations for Other Services OT consult   Frequency Min 3X/week    Precautions / Restrictions Precautions Precautions: Fall Restrictions Weight Bearing Restrictions: No   Pertinent Vitals/Pain No pain      Mobility  Bed Mobility Bed Mobility: Supine to Sit;Sitting - Scoot to Edge of Bed Supine to Sit: 5: Supervision Sitting - Scoot to Edge of Bed: 5: Supervision Details for Bed Mobility Assistance: Supervision and cues for safety.  Pt somewhat impulsive to get to EOB/OOB.  Transfers Transfers: Sit to Stand;Stand to Sit Sit to Stand: 4: Min guard;With upper extremity assist;From bed Stand to Sit: 4: Min guard;With upper extremity assist;With armrests;To chair/3-in-1 Details for Transfer Assistance: Min/guard for safety with cues for hand placement, technique and safety.  Ambulation/Gait Ambulation/Gait Assistance: 4: Min assist Ambulation Distance (Feet): 400 Feet Assistive device: Rolling walker Ambulation/Gait Assistance Details: Pt very  unsteady with RW and tended to lean right, however steer RW left.  Max cues for maintaining positioning inside of RW while ambulating.  Assist required for proper steering and maintaining proper position of RW throughout. O2 sats and HR remained stable throughout session.  Gait Pattern: Step-through pattern;Decreased stride length;Narrow base of support;Shuffle Gait velocity: somewhat increased with pt wanting to push RW too far ahead of him.  Cues for slower gait speed to increase safety.  Stairs: No Wheelchair Mobility Wheelchair Mobility: No    Shoulder Instructions     Exercises     PT Diagnosis: Difficulty walking;Generalized weakness  PT Problem List: Decreased strength;Decreased activity tolerance;Decreased balance;Decreased mobility;Decreased knowledge of use of DME;Decreased safety awareness PT Treatment Interventions: DME instruction;Gait training;Stair training;Functional mobility training;Therapeutic activities;Therapeutic exercise;Balance training;Patient/family education   PT Goals Acute Rehab PT Goals PT Goal Formulation: With patient Time For Goal Achievement: 09/25/12 Potential to Achieve Goals: Good Pt will go Sit to Stand: with modified independence PT Goal: Sit to Stand - Progress: Goal set today Pt will go Stand to Sit: with modified independence PT Goal: Stand to Sit - Progress: Goal set today Pt will Ambulate: >150 feet;with least restrictive assistive device;with modified independence PT Goal: Ambulate - Progress: Goal set today Pt will Go Up / Down Stairs:  (will set stair goal pending pt progress. ) Pt will Perform Home Exercise Program: with supervision, verbal cues required/provided PT Goal: Perform Home Exercise Program - Progress: Goal set today  Visit Information  Last PT Received On: 09/11/12 Assistance Needed: +2 ( for safety/lines)    Subjective Data  Subjective: I want to walk around in the hallway Patient Stated Goal: to  get back home.      Prior Functioning  Home Living Lives With: Spouse Available Help at Discharge: Family Type of Home: House Bathroom Shower/Tub: Tub/shower unit Prior Function Level of Independence: Needs assistance Needs Assistance: Dressing;Bathing;Grooming;Gait Gait Assistance: Pt's wife was assisting pt while ambulating for past week or so.  Driving: Yes Vocation: Retired Comments: Pt/wife state that pts wife was assisting somewhat with ADL's for past week or so due to weakness.  Communication Communication: No difficulties    Cognition  Overall Cognitive Status: Appears within functional limits for tasks assessed/performed Arousal/Alertness: Awake/alert Orientation Level: Appears intact for tasks assessed Behavior During Session: [facility] for tasks performed    Extremity/Trunk Assessment Right Lower Extremity Assessment RLE ROM/Strength/Tone: Deficits RLE ROM/Strength/Tone Deficits: Generalized weakness, however grossly 3/5 per functional assessment.  RLE Sensation: WFL - Light Touch Left Lower Extremity Assessment LLE ROM/Strength/Tone: Deficits LLE ROM/Strength/Tone Deficits: Generalized weakness, however grossly 3/5 per functional assessment.  LLE Sensation: WFL - Light Touch Trunk Assessment Trunk Assessment: Kyphotic   Balance    End of Session PT - End of Session Equipment Utilized During Treatment: Gait belt Activity Tolerance: Patient tolerated treatment well Patient left: in chair;with call bell/phone within reach;with family/visitor present Nurse Communication: Mobility status  GP     Page, Meribeth Mattes 09/11/2012, 11:21 AM

## 2012-09-11 NOTE — Progress Notes (Signed)
Occupational Therapy Evaluation Patient Details Name: Corey [name] MRN: [id number] DOB: August 08, 1929 Today's Date: 09/11/2012 Time: 1120-1140 OT Time Calculation (min): 20 min  OT Assessment / Plan / Recommendation Clinical Impression  this 78 year old man was admitted with 4 week h/o progressive weakness.  He has a h/o Automotive engineer.  Pt was independent with all adls/iadls 4 weeks ago but recently needed assist with adls.  he is appropriate for skilled OT with supervision level goals in acute    OT Assessment  Patient needs continued OT Services    Follow Up Recommendations  Home health OT    Barriers to Discharge      Equipment Recommendations  Rolling walker with 5" wheels;Cane    Recommendations for Other Services    Frequency  Min 2X/week    Precautions / Restrictions Precautions Precautions: Fall Restrictions Weight Bearing Restrictions: No   Pertinent Vitals/Pain No pain.  HR 84, sats 99% RA    ADL  Grooming: Simulated;Min guard Where Assessed - Grooming: Supported standing Upper Body Bathing: Simulated;Set up Where Assessed - Upper Body Bathing: Unsupported sitting Lower Body Bathing: Simulated;Min guard Where Assessed - Lower Body Bathing: Supported sit to stand Upper Body Dressing: Simulated;Minimal assistance (lines) Where Assessed - Upper Body Dressing: Supported sitting Lower Body Dressing: Performed;Min guard Where Assessed - Lower Body Dressing: Supported sit to Statistician and Hygiene: Simulated;Supervision/safety Where Assessed - Engineer, mining and Hygiene: Sit to stand from 3-in-1 or toilet Equipment Used: Rolling walker Transfers/Ambulation Related to ADLs: anxious to move.  Put foot up on chair to adjust sock; cued to sit down for safety ADL Comments: Discussed energy conservation strategies.  They have a transport chair if needed.  Pt is good about pacing himself and taking rest breaks.      OT Diagnosis:  Generalized weakness  OT Problem List:   OT Treatment Interventions:     OT Goals Acute Rehab OT Goals OT Goal Formulation: With patient Time For Goal Achievement: 09/25/12 Potential to Achieve Goals: Good ADL Goals Pt Will Perform Grooming: with supervision;Standing at sink ADL Goal: Grooming - Progress: Goal set today Pt Will Perform Lower Body Bathing: with supervision;Sit to stand from chair ADL Goal: Lower Body Bathing - Progress: Goal set today Pt Will Perform Lower Body Dressing: with supervision;Sit to stand from chair ADL Goal: Lower Body Dressing - Progress: Goal set today Pt Will Transfer to Toilet: with supervision;Ambulation;Comfort height toilet ADL Goal: Toilet Transfer - Progress: Goal set today Pt Will Perform Tub/Shower Transfer: Tub transfer;with min assist;Ambulation;Shower seat with back (min guard) ADL Goal: Web designer - Progress: Goal set today Miscellaneous OT Goals Miscellaneous OT Goal #1: Pt will gather clothes at supervision level with rw OT Goal: Miscellaneous Goal #1 - Progress: Goal set today  Visit Information  Last OT Received On: 09/11/12 Assistance Needed: +2 (for lines)    Subjective Data  Subjective: I feel like I'm getting stronger.  I don't have a walker at home but I have crutches and a cane (old) Patient Stated Goal: Get back to being independent and driving   Prior Functioning     Home Living Lives With: Spouse Available Help at Discharge: Family Type of Home: House Home Access: Stairs to enter Secretary/administrator of Steps: 4 Home Layout: One level Bathroom Shower/Tub: Other (comment) (grab bars and hand held shower) Bathroom Toilet: Handicapped height Home Adaptive Equipment: Crutches;Straight cane;Shower chair with back Prior Function Level of Independence: Needs assistance (for 4 weeks;  was independent prior to this) Needs Assistance: Dressing;Bathing;Grooming;Gait Gait Assistance: Pt's wife was assisting pt  while ambulating for past week or so.  Driving: Yes Vocation: Retired Comments: Pt/wife state that pts wife was assisting somewhat with ADL's for past week or so due to weakness.  Communication Communication: No difficulties         Vision/Perception     Cognition  Overall Cognitive Status: Appears within functional limits for tasks assessed/performed Arousal/Alertness: Awake/alert Orientation Level: Appears intact for tasks assessed Behavior During Session: [facility] for tasks performed Cognition - Other Comments: Pt is used to being independent.  Cued for safety once, when standing and needing to adjust sock    Extremity/Trunk Assessment Right Upper Extremity Assessment RUE ROM/Strength/Tone: [facility] for tasks assessed Left Upper Extremity Assessment LUE ROM/Strength/Tone: WFL for tasks assessed Trunk Assessment Trunk Assessment: Kyphotic     Mobility  Transfers Sit to Stand: 4: Min guard Stand to Sit: 4: Min guard;With upper extremity assist;With armrests;To chair/3-in-1 Details for Transfer Assistance: Min/guard for safety with cues for hand placement, technique and safety.      Shoulder Instructions     Exercise     Balance     End of Session OT - End of Session Activity Tolerance: Patient tolerated treatment well Patient left: in bed;with call bell/phone within reach;with family/visitor present  GO     Corey [name] 09/11/2012, 12:22 PM Marica Otter, OTR/L [phone] 09/11/2012

## 2012-09-11 NOTE — Progress Notes (Signed)
Pt has order to resume peg tube feeding at 2000. No order for type of feeding if feeding is bolus or tube feeding. MD on called paged awaiting call back. Will continue to monitor.

## 2012-09-11 NOTE — Op Note (Signed)
[facility] [address] [place], [address]   ENDOSCOPY PROCEDURE REPORT  PATIENT: Corey [name], Corey [name]  MR#: [id number] BIRTHDATE: 30-May-1929 , 82  yrs. old GENDER: Male ENDOSCOPIST: Willis Modena, MD REFERRED BY:  Chilton Greathouse, M.D. PROCEDURE DATE:  09/11/2012 PROCEDURE:  EGD w/ control of bleeding and EGD w/ directed submucosal injection(s), any substance ASA CLASS:     Class III INDICATIONS:  acute post hemorrhagic anemia.   melena. MEDICATIONS: Fentanyl 50 mcg IV and Versed 5 mg IV TOPICAL ANESTHETIC: Cetacaine Spray  DESCRIPTION OF PROCEDURE: After the risks benefits and alternatives of the procedure were thoroughly explained, informed consent was obtained.  The    endoscope was introduced through the mouth and advanced to the second portion of the duodenum. Without limitations.  The instrument was slowly withdrawn as the mucosa was fully examined.   Findings:  Large distal esophageal diverticulum, a few centimeters proximal to GE junction; otherwise normal esophagus.  PEG seen within stomach; no bumper-related ulcer; stomach otherwise normal. Two small ulcers in duodenal bulb, one of which had large visible vessel, seen to be oozing fresh blood.  Diluted EPI 1:10,000 was injected quadrantically around the ulcer site with good blanching effect.  A total of 3 Hemoclips were applied over the vessel. Hemostasis was achieved.  Duodenum otherwise normal to second portion.       Retroflexion into the cardia was normal. The scope was then withdrawn from the patient and the procedure completed.  ENDOSCOPIC IMPRESSION:     As above.  Duodenal ulcer with actively bleeding visible vessel, highly likely source of melena and acute blood loss anemia.  Hemostatic therapy applied as above, with hemostasis achieved.  RECOMMENDATIONS:     1.  Watch for potential complications of procedure. 2.  Protonix drip for the next 48 hours. 3.  H. pylori serologies. 4.   Avoid/Minimize NSAIDs as clinically feasible. 5.  OK to resume PEG feedings. No food/liquid by mouth. 6.  If patient has active rebleeding, will likely need angiography for possible embolization of gastroduodenal artery distribution. Doubt utility of repeat endoscopy. 7.  [facility] inpatient team to follow.  eSigned:  Willis Modena, MD 09/11/2012 2:05 PM   CC:

## 2012-09-11 NOTE — Interval H&P Note (Signed)
History and Physical Interval Note:  09/11/2012 12:33 PM  Corey [name]  has presented today for surgery, with the diagnosis of anemia, dark stool  The various methods of treatment have been discussed with the patient and family. After consideration of risks, benefits and other options for treatment, the patient has consented to  Procedure(s) (LRB) with comments: ESOPHAGOGASTRODUODENOSCOPY (EGD) (Left) as a surgical intervention .  The patient's history has been reviewed, patient examined, no change in status, stable for surgery.  I have reviewed the patient's chart and labs.  Questions were answered to the patient's satisfaction.     Corey [name] M  Assessment:  1.  Anemia, dark stool  Plan:  1.  Endoscopy today. 2.  Risks (bleeding, infection, bowel perforation that could require surgery, sedation-related changes in cardiopulmonary systems), benefits (identification and possible treatment of source of symptoms, exclusion of certain causes of symptoms), and alternatives (watchful waiting, radiographic imaging studies, empiric medical treatment) of upper endoscopy (EGD) were explained to patient/wife in detail and patient wishes to proceed. 3.  Consider colonoscopy if endoscopy is unrevealing.

## 2012-09-11 NOTE — H&P (View-Only) (Signed)
PROGRESS NOTE  PLUMMER MATICH ZOX:[id number] DOB: [birth year] DOA: 09/10/2012 PCP: Hoyle Sauer, MD  Brief narrative: Pleasant 78 year old male with history of achalasia cardia of the esophagus status post PEG placement 02/2011 by a general surgeon presented to [facility] emergency room was noted hypotension and bleeding 09/10/2012 after cardiology office visit. Patient was transfused 2 units packed red blood cells 10/1/2 2013 and is scheduled for an upper endoscopy and further gastroenterology followup today  Past medical history-As per Problem list Chart reviewed as below-  Admission 09/24/2003 c vascualr seeing for Infrarenal Abdominal Aortic Aneurysm, Bilateral iliac, aneurysm, single widely patent bilateral arteries  Admit for repair of infrarenal abd aneurysm 10/08/03-had ischemic leg as well, developed vent dependant resp failure  Admit 09/19/2005 c mod Coronary artery atheroscleorosis 20-30%, 40-50% narrowing  Admit 08/07/07 with "food impaction" by GI-found to have achalasia.  Admission for Urinary retention by Dr. Patsi Sears 12/26/07-TURP  Seen in ED by Dr. Parks Ranger PEG placed 02/2011 by Dr. Derrell Lolling (gen surg) tube placement at that time-bleeding noted as well c PEg tube then thatw as replaced with a larger sized one-Endoscopy at that time showed distal esophageal diverticula c small pressure-related isch ulcer  Severe AI noted on ECHO  Office visit 09/10/12 noted hypotension and some bleeding  Consultants:  Gastroenterology Dr. Dulce Sellar  Procedures:  None at present  Transfused 2 units packed red blood cells 09/10/12  Antibiotics:  None   Subjective  Feels much stronger after blood transfusion. Wife relates that patient has had continuous problems with the tube for the past 3-4 months Patient has been evaluated multiple times since August for leaking G-tube site Patient has also been treated for stasis dermatitis and fungal dermatitis because of leaking  tube Patient's wife relates that patient takes 5 cans of feeds starting at 6 AM and finishing most of time if 7 PM and most of the sugars at night Patient denies any chest pain however states he has occasional shortness of breath. He states he has no dizziness but his stools have never been brown since this J-tube was placed He states he has no nausea no vomiting no chest pain no blurred vision or double vision Patient has a chronic urinary insufficiency and troubles at night   Objective    Interim History: Reviewed completely the chart  Telemetry: Sinus rhythm with some PVCs  Objective: Filed Vitals:   09/10/12 2230 09/10/12 2300 09/11/12 0000 09/11/12 0400  BP:  109/54  114/50  Pulse: 77 80  61  Temp: 97.7 F (36.5 C)  97.6 F (36.4 C) 97.3 F (36.3 C)  TempSrc: Oral  Oral Oral  Resp: 15 16    Weight:   148 lb 9.4 oz (67.4 kg)   SpO2:  99%  96%    Intake/Output Summary (Last 24 hours) at 09/11/12 8119 Last data filed at 09/11/12 0600  Gross per 24 hour  Intake   1878 ml  Output    600 ml  Net   1278 ml    Exam:  General: Alert and pleasant male looking younger than stated age, some pallor no icterus, dentures Cardiovascular: 1 S2 grade 3/6 murmur right upper sternal left upper sternal edge and at least one other murmur in the left fifth intercostal space. Respiratory: Medically clear no added sound Abdomen: Of nontender nondistended with midline lower abdominal scar PEG tube in situ however some oozing around the inner skin. Skin see above Neuro grossly intact  Data Reviewed: Basic Metabolic Panel:  Lab 09/11/12  1610 09/10/12 1410 09/10/12 0947  NA 142 144 144  K 3.7 4.0 --  CL 112 111 113*  CO2 22 25 25   GLUCOSE 83 93 89  BUN 39* 41* 41*  CREATININE 0.70 0.73 0.8  CALCIUM 8.0* 8.8 8.6  MG -- 2.7* --  PHOS -- -- --   Liver Function Tests:  Lab 09/11/12 0305  AST 22  ALT 21  ALKPHOS 103  BILITOT 0.7  PROT 4.5*  ALBUMIN 2.6*   No results found  for this basename: LIPASE:5,AMYLASE:5 in the last 168 hours No results found for this basename: AMMONIA:5 in the last 168 hours CBC:  Lab 09/11/12 0305 09/10/12 1410 09/10/12 0947  WBC 4.7 5.4 5.9  NEUTROABS -- 3.9 4.5  HGB 7.8* 7.4* 7.6 Repeated and verified X2.*  HCT 24.5* 24.8* 24.5 Repeated and verified X2.*  MCV 98.4 106.9* 104.4*  PLT 146* 182 168.0   Cardiac Enzymes: No results found for this basename: CKTOTAL:5,CKMB:5,CKMBINDEX:5,TROPONINI:5 in the last 168 hours BNP: No components found with this basename: POCBNP:5 CBG: No results found for this basename: GLUCAP:5 in the last 168 hours  Recent Results (from the past 240 hour(s))  MRSA PCR SCREENING     Status: Normal   Collection Time   09/10/12  7:53 PM      Component Value Range Status Comment   MRSA by PCR NEGATIVE  NEGATIVE Final      Studies:              All Imaging reviewed and is as per above notation   Scheduled Meds:   . influenza  inactive virus vaccine  0.5 mL Intramuscular Tomorrow-1000  . loratadine  10 mg Oral Daily  . pantoprazole (PROTONIX) IV  40 mg Intravenous Q12H  . sodium chloride  500 mL Intravenous Once  . sodium chloride  3 mL Intravenous Q12H   Continuous Infusions:   . sodium chloride 75 mL/hr (09/11/12 0600)  . sodium chloride    . sodium chloride       Assessment/Plan: 1. Severe blood loss anemia-status post 2 units packed red blood cells. Hemoglobin this morning at 3 and was 7.8 we will repeat CNet and CBC stat 2. Likely upper GI bleed from PEG associated gastric ulcer/achalasia cardia with large ulcer-per gastroenterology-appreciate input. I have explained to family my role in patient's care and will defer to gastroenterology as further plans in terms of up-size after tube versus other surgical procedures that may be considered. Patient states he has gained over 30 pounds after the PEG tube was placed and he is relatively highly functional at home. PT OT to see patient 3. History  severe aortic insufficiency/regurgitation-not a candidate for general surgery at this time-he is to be medically managed from what I am able to gather 4. History of CHF with grade 1 diastolic dysfunction EF is 55-60% 01/10/2012-euvolemic and compensated at this time 5. History of AAA surgery-stable at this time-outpatient surveillance per primary care physician 6. Hypertension-at present time hold irbesartan 150 mg daily and mild to moderate hypotension 7. Hyperlipidemia-outpatient followup  Code Status: Full Family Communication: Discussed with wife in detail at bedside plan of care and hopefully course of events-patient may need general surgery involved in patient's care is after endoscopy depending on gastroenterologist Disposition Plan: Step down today likely transferred to a general medical telemetry floor tomorrow-family and patient wished to go home once this is resolved   Pleas Koch, MD  [facility] Pager [phone] 09/11/2012, 8:12 AM  LOS: 1 day

## 2012-09-11 NOTE — Progress Notes (Signed)
PROGRESS NOTE  Corey [name] MRN:[id number] DOB: 09/27/1929 DOA: 09/10/2012 PCP: AVVA,RAVISANKAR R, MD  Brief narrative: Pleasant 78-year-old male with history of achalasia cardia of the esophagus status post PEG placement 02/2011 by a general surgeon presented to [place] emergency room was noted hypotension and bleeding 09/10/2012 after cardiology office visit. Patient was transfused 2 units packed red blood cells 10/1/2 2013 and is scheduled for an upper endoscopy and further gastroenterology followup today  Past medical history-As per Problem list Chart reviewed as below-  Admission 09/24/2003 c vascualr seeing for Infrarenal Abdominal Aortic Aneurysm, Bilateral iliac, aneurysm, single widely patent bilateral arteries  Admit for repair of infrarenal abd aneurysm 10/08/03-had ischemic leg as well, developed vent dependant resp failure  Admit 09/19/2005 c mod Coronary artery atheroscleorosis 20-30%, 40-50% narrowing  Admit 08/07/07 with "food impaction" by GI-found to have achalasia.  Admission for Urinary retention by Dr. Tannenbaum 12/26/07-TURP  Seen in ED by Dr. Outlaw-Noted PEG placed 02/2011 by Dr. Ingram (gen surg) tube placement at that time-bleeding noted as well c PEg tube then thatw as replaced with a larger sized one-Endoscopy at that time showed distal esophageal diverticula c small pressure-related isch ulcer  Severe AI noted on ECHO  Office visit 09/10/12 noted hypotension and some bleeding  Consultants:  Gastroenterology Dr. outlaw  Procedures:  None at present  Transfused 2 units packed red blood cells 09/10/12  Antibiotics:  None   Subjective  Feels much stronger after blood transfusion. Wife relates that patient has had continuous problems with the tube for the past 3-4 months Patient has been evaluated multiple times since August for leaking G-tube site Patient has also been treated for stasis dermatitis and fungal dermatitis because of leaking  tube Patient's wife relates that patient takes 5 cans of feeds starting at 6 AM and finishing most of time if 7 PM and most of the sugars at night Patient denies any chest pain however states he has occasional shortness of breath. He states he has no dizziness but his stools have never been brown since this J-tube was placed He states he has no nausea no vomiting no chest pain no blurred vision or double vision Patient has a chronic urinary insufficiency and troubles at night   Objective    Interim History: Reviewed completely the chart  Telemetry: Sinus rhythm with some PVCs  Objective: Filed Vitals:   09/10/12 2230 09/10/12 2300 09/11/12 0000 09/11/12 0400  BP:  109/54  114/50  Pulse: 77 80  61  Temp: 97.7 F (36.5 C)  97.6 F (36.4 C) 97.3 F (36.3 C)  TempSrc: Oral  Oral Oral  Resp: 15 16    Weight:   148 lb 9.4 oz (67.4 kg)   SpO2:  99%  96%    Intake/Output Summary (Last 24 hours) at 09/11/12 0812 Last data filed at 09/11/12 0600  Gross per 24 hour  Intake   1878 ml  Output    600 ml  Net   1278 ml    Exam:  General: Alert and pleasant male looking younger than stated age, some pallor no icterus, dentures Cardiovascular: 1 S2 grade 3/6 murmur right upper sternal left upper sternal edge and at least one other murmur in the left fifth intercostal space. Respiratory: Medically clear no added sound Abdomen: Of nontender nondistended with midline lower abdominal scar PEG tube in situ however some oozing around the inner skin. Skin see above Neuro grossly intact  Data Reviewed: Basic Metabolic Panel:  Lab 09/11/12   0305 09/10/12 1410 09/10/12 0947  NA 142 144 144  K 3.7 4.0 --  CL 112 111 113*  CO2 22 25 25  GLUCOSE 83 93 89  BUN 39* 41* 41*  CREATININE 0.70 0.73 0.8  CALCIUM 8.0* 8.8 8.6  MG -- 2.7* --  PHOS -- -- --   Liver Function Tests:  Lab 09/11/12 0305  AST 22  ALT 21  ALKPHOS 103  BILITOT 0.7  PROT 4.5*  ALBUMIN 2.6*   No results found  for this basename: LIPASE:5,AMYLASE:5 in the last 168 hours No results found for this basename: AMMONIA:5 in the last 168 hours CBC:  Lab 09/11/12 0305 09/10/12 1410 09/10/12 0947  WBC 4.7 5.4 5.9  NEUTROABS -- 3.9 4.5  HGB 7.8* 7.4* 7.6 Repeated and verified X2.*  HCT 24.5* 24.8* 24.5 Repeated and verified X2.*  MCV 98.4 106.9* 104.4*  PLT 146* 182 168.0   Cardiac Enzymes: No results found for this basename: CKTOTAL:5,CKMB:5,CKMBINDEX:5,TROPONINI:5 in the last 168 hours BNP: No components found with this basename: POCBNP:5 CBG: No results found for this basename: GLUCAP:5 in the last 168 hours  Recent Results (from the past 240 hour(s))  MRSA PCR SCREENING     Status: Normal   Collection Time   09/10/12  7:53 PM      Component Value Range Status Comment   MRSA by PCR NEGATIVE  NEGATIVE Final      Studies:              All Imaging reviewed and is as per above notation   Scheduled Meds:   . influenza  inactive virus vaccine  0.5 mL Intramuscular Tomorrow-1000  . loratadine  10 mg Oral Daily  . pantoprazole (PROTONIX) IV  40 mg Intravenous Q12H  . sodium chloride  500 mL Intravenous Once  . sodium chloride  3 mL Intravenous Q12H   Continuous Infusions:   . sodium chloride 75 mL/hr (09/11/12 0600)  . sodium chloride    . sodium chloride       Assessment/Plan: 1. Severe blood loss anemia-status post 2 units packed red blood cells. Hemoglobin this morning at 3 and was 7.8 we will repeat CNet and CBC stat 2. Likely upper GI bleed from PEG associated gastric ulcer/achalasia cardia with large ulcer-per gastroenterology-appreciate input. I have explained to family my role in patient's care and will defer to gastroenterology as further plans in terms of up-size after tube versus other surgical procedures that may be considered. Patient states he has gained over 30 pounds after the PEG tube was placed and he is relatively highly functional at home. PT OT to see patient 3. History  severe aortic insufficiency/regurgitation-not a candidate for general surgery at this time-he is to be medically managed from what I am able to gather 4. History of CHF with grade 1 diastolic dysfunction EF is 55-60% 01/10/2012-euvolemic and compensated at this time 5. History of AAA surgery-stable at this time-outpatient surveillance per primary care physician 6. Hypertension-at present time hold irbesartan 150 mg daily and mild to moderate hypotension 7. Hyperlipidemia-outpatient followup  Code Status: Full Family Communication: Discussed with wife in detail at bedside plan of care and hopefully course of events-patient may need general surgery involved in patient's care is after endoscopy depending on gastroenterologist Disposition Plan: Step down today likely transferred to a general medical telemetry floor tomorrow-family and patient wished to go home once this is resolved   Jai Mort Smelser, MD  [facility] Pager [phone] 09/11/2012, 8:12 AM      LOS: 1 day      

## 2012-09-12 ENCOUNTER — Encounter (HOSPITAL_COMMUNITY): Payer: Self-pay

## 2012-09-12 ENCOUNTER — Encounter (HOSPITAL_COMMUNITY): Payer: Self-pay | Admitting: Gastroenterology

## 2012-09-12 LAB — CBC WITH DIFFERENTIAL/PLATELET
Basophils Absolute: 0 10*3/uL (ref 0.0–0.1)
Basophils Relative: 0 % (ref 0–1)
Eosinophils Absolute: 0 10*3/uL (ref 0.0–0.7)
Eosinophils Relative: 1 % (ref 0–5)
Lymphs Abs: 0.6 10*3/uL — ABNORMAL LOW (ref 0.7–4.0)
MCH: 30.8 pg (ref 26.0–34.0)
MCHC: 31.4 g/dL (ref 30.0–36.0)
MCV: 98.3 fL (ref 78.0–100.0)
Platelets: 124 10*3/uL — ABNORMAL LOW (ref 150–400)
RDW: 18.4 % — ABNORMAL HIGH (ref 11.5–15.5)

## 2012-09-12 LAB — CBC
MCV: 97.9 fL (ref 78.0–100.0)
Platelets: 180 10*3/uL (ref 150–400)
RDW: 18.2 % — ABNORMAL HIGH (ref 11.5–15.5)
WBC: 5.6 10*3/uL (ref 4.0–10.5)

## 2012-09-12 LAB — URINE CULTURE

## 2012-09-12 MED ORDER — LORATADINE 10 MG PO TABS
10.0000 mg | ORAL_TABLET | Freq: Every day | ORAL | Status: DC | PRN
  Filled 2012-09-12: qty 1

## 2012-09-12 MED ORDER — NON FORMULARY: 237.0000 mL/d | Freq: Every day | Status: DC

## 2012-09-12 MED ORDER — INFLUENZA VIRUS VACC SPLIT PF IM SUSP
0.5000 mL | INTRAMUSCULAR | Status: AC
  Administered 2012-09-13: 0.5 mL via INTRAMUSCULAR
  Filled 2012-09-12: qty 0.5

## 2012-09-12 MED ORDER — TWOCAL HN PO LIQD
237.0000 mL | Freq: Every day | ORAL | Status: DC
  Administered 2012-09-12 – 2012-09-14 (×10): 237 mL via ORAL
  Filled 2012-09-12 (×14): qty 237

## 2012-09-12 NOTE — Progress Notes (Signed)
Subjective: Bit confused after sedation from EGD yesterday, but now back to baseline. No further melena.  Objective: Vital signs in last 24 hours: Temp:  [97.5 F (36.4 C)-98.7 F (37.1 C)] 98.7 F (37.1 C) (10/03 0400) Pulse Rate:  [74-95] 74  (10/03 0315) Resp:  [14-36] 23  (10/03 0315) BP: (89-160)/(27-93) 114/63 mmHg (10/03 0315) SpO2:  [95 %-100 %] 95 % (10/03 0315) Weight:  [66.8 kg (147 lb 4.3 oz)] 66.8 kg (147 lb 4.3 oz) (10/03 0400) Weight change: -0.6 kg (-1 lb 5.2 oz) Last BM Date:  (PTA)  PE: GEN:  NAD ABD:  Soft  Lab Results: CBC    Component Value Date/Time   WBC 3.8* 09/12/2012 0310   RBC 2.40* 09/12/2012 0310   HGB 7.4* 09/12/2012 0310   HCT 23.6* 09/12/2012 0310   PLT 124* 09/12/2012 0310   MCV 98.3 09/12/2012 0310   MCH 30.8 09/12/2012 0310   MCHC 31.4 09/12/2012 0310   RDW 18.4* 09/12/2012 0310   LYMPHSABS 0.6* 09/12/2012 0310   MONOABS 0.4 09/12/2012 0310   EOSABS 0.0 09/12/2012 0310   BASOSABS 0.0 09/12/2012 0310   Assessment:  1.  Melena with bleeding duodenal ulcer, EGD with hemostatic therapy yesterday.  No further overt bleeding. Ulcer likely NSAID-related. 2.  Anemia, blood loss, dip from yesterday might be volume shift-related or dilutional.  No active bleeding.  Plan:  1.  OK to have sips of liquids and coffee (which was his baseline practice), and PEG tube feeds have resumed. 2.  Protonix drip for another 24 hours, then hopefully transition to po bid therapy tomorrow. 3.  Avoid NSAIDs as clinically feasible. 4.  OK to transition to floor bed today. 5.  Will follow.  Case discussed with Dr. Mahala Menghini.   [name] 09/12/2012, 9:20 AM

## 2012-09-12 NOTE — Progress Notes (Signed)
INITIAL ADULT NUTRITION ASSESSMENT Date: 09/12/2012   Time: 11:29 AM Reason for Assessment: Consult for TF initiation and management.  Pt on home TF  ASSESSMENT: Male 78 y.o.  Dx: Anemia- likely upper GI bleed from PEG associated gastric ulcer/achalasia cardia with large ulcer,.   Pt with a history of achalasia cardia of the esophagus status post PEG 02/28/11  Hx:  Past Medical History  Diagnosis Date  . Aortic insufficiency     severe per echo January 2013  . Achalasia, esophageal     s/p PEG tube  . Dysphagia   . Hypertension   . Encephalopathy     Post AAA surgery  . Hyperlipidemia   . Bruises easily   . PVD (peripheral vascular disease)   . AAA (abdominal aortic aneurysm)   . GI bleed January 2013    thru PEG tube with associated mild shock  . Esophageal diverticulum 09/10/2012    Large distal esophageal diverticulum leading to feeding difficulties.  . Gastric ulcer 09/10/2012    Hx of PEG associated gastric ulcer  . Complication of anesthesia     confusion at times   Past Surgical History  Procedure Date  . Cardiac catheterization 09/19/2005    NORMAL. EF 65%  . Abdominal aortic aneurysm repair   . Transurethral resection of prostate   . Hernia repair   . Peg placement 2012  . Esophagogastroduodenoscopy 12/23/2011    Procedure: ESOPHAGOGASTRODUODENOSCOPY (EGD);  Surgeon: [name], MD;  Location: [facility];  Service: Endoscopy;  Laterality: N/A;  . Esophagogastroduodenoscopy 09/11/2012    Procedure: ESOPHAGOGASTRODUODENOSCOPY (EGD);  Surgeon: Willis Modena, MD;  Location: [facility];  Service: Endoscopy;  Laterality: Left;   Related Meds:     . feeding supplement (OSMOLITE 1.5 CAL)  237 mL Per Tube Q3H  . pantoprazole (PROTONIX) IV  80 mg Intravenous Once  . sodium chloride  3 mL Intravenous Q12H  . DISCONTD: loratadine  10 mg Oral Daily  . DISCONTD: pantoprazole (PROTONIX) IV  40 mg Intravenous Q12H    Ht: 5\' 9"  (175.3 cm)  Wt: 147 lb 4.3 oz  (66.8 kg)  Ideal Wt: 72.7 kg % Ideal Wt: 92  Usual Wt:  Wt Readings from Last 10 Encounters:  09/12/12 147 lb 4.3 oz (66.8 kg)  09/12/12 147 lb 4.3 oz (66.8 kg)  09/10/12 145 lb (65.772 kg)  06/07/12 146 lb (66.225 kg)  05/07/12 144 lb (65.318 kg)  01/05/12 143 lb 6.4 oz (65.046 kg)  12/24/11 151 lb 9.6 oz (68.765 kg)  12/24/11 151 lb 9.6 oz (68.765 kg)  12/22/11 146 lb 6.4 oz (66.407 kg)  06/05/11 125 lb 2 oz (56.756 kg)    % Usual Wt: 100% over the past 9 months  Body mass index is 21.75 kg/(m^2).  Labs:  CMP     Component Value Date/Time   NA 143 09/11/2012 1012   K 3.9 09/11/2012 1012   CL 113* 09/11/2012 1012   CO2 21 09/11/2012 1012   GLUCOSE 82 09/11/2012 1012   BUN 37* 09/11/2012 1012   CREATININE 0.74 09/11/2012 1012   CALCIUM 8.3* 09/11/2012 1012   PROT 5.0* 09/11/2012 1012   ALBUMIN 2.8* 09/11/2012 1012   AST 25 09/11/2012 1012   ALT 24 09/11/2012 1012   ALKPHOS 116 09/11/2012 1012   BILITOT 0.9 09/11/2012 1012   GFRNONAA 85* 09/11/2012 1012   GFRAA >90 09/11/2012 1012   I/O last 3 completed shifts: In: 3080.1 [I.V.:2280.1; Blood:700; IV Piggyback:100] Out: 1050 [Urine:1050]  Diet Order: Coffee and clear liquids by mouth (which was his baseline), ice chips  Supplements/Tube Feeding:  Osmolite 1.5 1 can every 3 hours.  IVF:    sodium chloride Last Rate: 50 mL/hr at 09/11/12 2342  sodium chloride   pantoprozole (PROTONIX) infusion Last Rate: 8 mg/hr (09/12/12 0311)  DISCONTD: sodium chloride     Estimated Nutritional Needs:   Kcal: 1900-2200  Protein: 90-100 gm Fluid: >1.9L Food/Nutrition Related Hx: Pt was on small amounts clear liquids prior to admit plus PEG feedings of 246ml(1can) TWO CAL HN 5 times daily plus 60 ml water flush after each feeding.  Wife reports problems with leaking around PEG site (TF/gastic juices) chronically and yeast problems around site.  Wife very concerned about this and the inability to find a solution to this problem.  At  times wife would only give 4 cans TF formula when "everyone was too exhausted".  Wife stated pt drank no more than 2 cups orally per day.  Discussed pt with wife and Char[employer] fundraiser.  Spoke with wound care nurse as well who will see pt regarding PEG site problem.  Pharmacy has 2 Cal HN so will continue this here.  5 cans TwoCal HN provide:  2375 kcal, 99.5 gm protein, 830 ml free water daily. Weight has remained stable for the past 9 months.  Followed by [facility] for TF supplies.  NUTRITION DIAGNOSIS: -Inadequate oral intake (NI-2.1).  Status: Ongoing  RELATED TO: altered GI function  AS EVIDENCE BY: clear liquid diet  MONITORING/EVALUATION(Goals): TF tolerance, weights, labs, i/o  EDUCATION NEEDS: -No education needs identified at this time  INTERVENTION: TwoCal HN 1 can 5 times per day per PEG When IV fluid d/c'ed, rec free water per PEG 120 ml after each feeding.  Dietitian [phone]  DOCUMENTATION CODES Per approved criteria  -Not Applicable    Jeoffrey Massed 09/12/2012, 11:29 AM

## 2012-09-12 NOTE — Progress Notes (Addendum)
PROGRESS NOTE  Corey [name]:[id number] DOB: 1929/04/24 DOA: 09/10/2012 PCP: Hoyle Sauer, MD  Brief narrative: Pleasant 78 year old male with history of achalasia cardia of the esophagus status post PEG placement 02/2011 by a general surgeon presented to [facility] long emergency room was noted hypotension and bleeding 09/10/2012 after cardiology office visit. Patient was transfused 2 units packed red blood cells 10/1/2 2013 and is scheduled for an upper endoscopy and further gastroenterology followup today  Past medical history-As per Problem list Chart reviewed as below-  Admission 09/24/2003 c vascualr seeing for Infrarenal Abdominal Aortic Aneurysm, Bilateral iliac, aneurysm, single widely patent bilateral arteries  Admit for repair of infrarenal abd aneurysm 10/08/03-had ischemic leg as well, developed vent dependant resp failure  Admit 09/19/2005 c mod Coronary artery atheroscleorosis 20-30%, 40-50% narrowing  Admit 08/07/07 with "food impaction" by GI-found to have achalasia.  Admission for Urinary retention by Dr. Patsi Sears 12/26/07-TURP  Seen in ED by Dr. Parks Ranger PEG placed 02/2011 by Dr. Derrell Lolling (gen surg) tube placement at that time-bleeding noted as well c PEg tube then that was replaced with a larger sized one-Endoscopy at that time showed distal esophageal diverticula c small pressure-related isch ulcer  Severe AI noted on ECHO  Office visit 09/10/12 noted hypotension and some bleeding  Consultants:  Gastroenterology Dr. Dulce Sellar  Procedures:  None at present  Transfused 2 units packed red blood cells 09/10/12  Upper endoscopy 10 2013 = large esophageal diverticulum 2 cm  function, duodenal ulcer actively bleeding visible vessel, highly likely source of acute blood loss anemia.  Antibiotics:  None   Subjective  Feels much stronger after blood transfusion.  Patient's wife relates that patient takes 5 cans of feeds starting at 6 AM and finishing most of time if  7 PM and most of the sugars at night. No stool as yet.  NO n/v.  No diarrhea    Objective    Interim History: Reviewed completely the chart  Telemetry: Sinus rhythm with some PVCs-went sinus tachy 126  Objective: Filed Vitals:   09/12/12 0000 09/12/12 0100 09/12/12 0315 09/12/12 0400  BP:  127/93 114/63   Pulse:  84 74   Temp: 97.5 F (36.4 C)   98.7 F (37.1 C)  TempSrc: Oral   Oral  Resp:  18 23   Height:      Weight:    147 lb 4.3 oz (66.8 kg)  SpO2:  97% 95%     Intake/Output Summary (Last 24 hours) at 09/12/12 0953 Last data filed at 09/12/12 0600  Gross per 24 hour  Intake 1477.08 ml  Output    450 ml  Net 1027.08 ml    Exam:  General: Alert and pleasant male looking younger than stated age, some pallor no icterus, dentures Cardiovascular: S1 S2 grade 3/6 murmur right upper sternal left upper sternal edge and at least one other murmur in the left fifth intercostal space. Respiratory: Medically clear no added sound Abdomen: Of nontender nondistended with midline lower abdominal scar PEG tube in situ however some oozing around the inner skin. Skin see above Neuro grossly intact  Data Reviewed: Basic Metabolic Panel:  Lab 09/11/12 8119 09/11/12 0305 09/10/12 1410 09/10/12 0947  NA 143 142 144 144  K 3.9 3.7 -- --  CL 113* 112 111 113*  CO2 21 22 25 25   GLUCOSE 82 83 93 89  BUN 37* 39* 41* 41*  CREATININE 0.74 0.70 0.73 0.8  CALCIUM 8.3* 8.0* 8.8 8.6  MG -- -- 2.7* --  PHOS -- -- -- --   Liver Function Tests:  Lab 09/11/12 1012 09/11/12 0305  AST 25 22  ALT 24 21  ALKPHOS 116 103  BILITOT 0.9 0.7  PROT 5.0* 4.5*  ALBUMIN 2.8* 2.6*   No results found for this basename: LIPASE:5,AMYLASE:5 in the last 168 hours No results found for this basename: AMMONIA:5 in the last 168 hours CBC:  Lab 09/12/12 0310 09/11/12 1012 09/11/12 0305 09/10/12 1410 09/10/12 0947  WBC 3.8* 5.5 4.7 5.4 5.9  NEUTROABS 2.8 4.1 -- 3.9 4.5  HGB 7.4* 8.6* 7.8* 7.4* 7.6  Repeated and verified X2.*  HCT 23.6* 27.3* 24.5* 24.8* 24.5 Repeated and verified X2.*  MCV 98.3 98.6 98.4 106.9* 104.4*  PLT 124* 156 146* 182 168.0   Cardiac Enzymes: No results found for this basename: CKTOTAL:5,CKMB:5,CKMBINDEX:5,TROPONINI:5 in the last 168 hours BNP: No components found with this basename: POCBNP:5 CBG: No results found for this basename: GLUCAP:5 in the last 168 hours  Recent Results (from the past 240 hour(s))  MRSA PCR SCREENING     Status: Normal   Collection Time   09/10/12  7:53 PM      Component Value Range Status Comment   MRSA by PCR NEGATIVE  NEGATIVE Final      Studies:              All Imaging reviewed and is as per above notation   Scheduled Meds:    . feeding supplement (OSMOLITE 1.5 CAL)  237 mL Per Tube Q3H  . influenza  inactive virus vaccine  0.5 mL Intramuscular Tomorrow-1000  . loratadine  10 mg Oral Daily  . pantoprazole (PROTONIX) IV  80 mg Intravenous Once  . sodium chloride  3 mL Intravenous Q12H  . DISCONTD: pantoprazole (PROTONIX) IV  40 mg Intravenous Q12H   Continuous Infusions:    . sodium chloride 50 mL/hr at 09/11/12 2342  . sodium chloride    . pantoprozole (PROTONIX) infusion 8 mg/hr (09/12/12 0311)  . DISCONTD: sodium chloride       Assessment/Plan: 1. Severe blood loss anemia-status post 2 units packed red blood cells. Hemoglobin this morning was 7.4-we'll repeat CBC at 2 PM and if patient transfer low 6.8 hemoglobin we will transfuse another unit of packed red blood cells. Patient also has a moderate thrombocytopenia which I suspect is likely secondary to purulent cell loss from anemia blood loss and I will not work this up at this time-if this persists and platelets drop below 100 we will get platelets 2. Likely upper GI bleed from PEG associated gastric ulcer/achalasia cardia with large ulcer-per gastroenterology-appreciate input. Patient would probably benefit from mesenteric angiogram and embolization if this  recurs per gastroenterologist PPI Gtt per GI until deemed appropriate for PO therapies 3. History severe aortic insufficiency/regurgitation-not a candidate for general surgery at this time-he is to be medically managed from what I am able to gather 4. History of CHF with grade 1 diastolic dysfunction EF is 55-60% 01/10/2012-euvolemic and compensated at this time 5. History of AAA surgery-stable at this time-outpatient surveillance per primary care physician 6. Moderate renal insufficiency-P. and creatinine ratio elevated at 40-1. Repeat be met in the morning. Secondary: Depletion and poor by mouth intake. 7. Malnutrition-the abdomen level is 2.8 total protein 5.0. We'll get nutrition consult 8. Hypertension-at present time hold irbesartan 150 mg daily and mild to moderate hypotension 9. Hyperlipidemia-outpatient followup  Code Status: Full Family Communication: Discussed with wife in detail at bedside plan of care and  hopefully course of events-transfer to telemetry today Disposition Plan: Step down today likely transferred to a general medical telemetry floor tomorrow-will need IV PPI therapy over the next 24-48 hours and potentially may need blood transfusion  Pleas Koch, MD  [facility] Pager [phone] 09/12/2012, 9:53 AM    LOS: 2 days

## 2012-09-12 NOTE — Progress Notes (Signed)
Physical Therapy Treatment Patient Details Name: Corey [name] MRN: [id number] DOB: Mar 03, 1929 Today's Date: 09/12/2012 Time: 7829-5621 PT Time Calculation (min): 27 min  PT Assessment / Plan / Recommendation Comments on Treatment Session  Pt presents with increased instability today.  Noted that HGB is somewhat lower than it was yesterday, however only c/o slight dizziness when up.  HR remained in low 90's throughout session.  Spoke with pt/wife regarding SNF placement, and both state that they would rather go home, however wife understands that he needs to be safe to D/C home.      Follow Up Recommendations  Home health PT;Post acute inpatient rehab    Barriers to Discharge        Equipment Recommendations  Rolling walker with 5" wheels;Cane    Recommendations for Other Services    Frequency Min 3X/week   Plan Discharge plan remains appropriate    Precautions / Restrictions Precautions Precautions: Fall Restrictions Weight Bearing Restrictions: No   Pertinent Vitals/Pain No pain    Mobility  Bed Mobility Bed Mobility: Supine to Sit;Sitting - Scoot to Edge of Bed Supine to Sit: 5: Supervision Sitting - Scoot to Edge of Bed: 5: Supervision Details for Bed Mobility Assistance: Supervision and cues for safety.  Pt somewhat impulsive to get to EOB/OOB.  Transfers Transfers: Sit to Stand;Stand to Sit Sit to Stand: 4: Min guard;With upper extremity assist;From bed Stand to Sit: 4: Min guard;With upper extremity assist;With armrests;To chair/3-in-1 Details for Transfer Assistance: Min/guard for safety and to steady somewhat with mod cues for hand placement when sitting/standing.  Ambulation/Gait Ambulation/Gait Assistance: 3: Mod assist Ambulation Distance (Feet): 200 Feet Assistive device: Rolling walker Ambulation/Gait Assistance Details: Pt with increased weakness/unsteadiness during todays session and almost had LOB x 2 prior to leaving room to ambulate in hallway.  Maintained  mod assist to steady throughout.  cues for maintaining position inside of RW.  Gait Pattern: Step-through pattern;Decreased stride length;Narrow base of support;Shuffle    Exercises General Exercises - Lower Extremity Ankle Circles/Pumps: AROM;Both;20 reps Quad Sets: Strengthening;Both;10 reps Heel Slides: Strengthening;Both;10 reps Hip ABduction/ADduction: Strengthening;Both;10 reps (Add with pillow squeeze)   PT Diagnosis:    PT Problem List:   PT Treatment Interventions:     PT Goals Acute Rehab PT Goals PT Goal Formulation: With patient Time For Goal Achievement: 09/25/12 Potential to Achieve Goals: Good Pt will go Sit to Stand: with modified independence PT Goal: Sit to Stand - Progress: Progressing toward goal Pt will go Stand to Sit: with modified independence PT Goal: Stand to Sit - Progress: Progressing toward goal Pt will Ambulate: >150 feet;with least restrictive assistive device;with modified independence PT Goal: Ambulate - Progress: Progressing toward goal Pt will Perform Home Exercise Program: with supervision, verbal cues required/provided PT Goal: Perform Home Exercise Program - Progress: Progressing toward goal  Visit Information  Last PT Received On: 09/12/12 Assistance Needed: +2    Subjective Data  Subjective: I know I'm still a little wobbly.  Patient Stated Goal: to get back home.    Cognition  Overall Cognitive Status: Appears within functional limits for tasks assessed/performed Arousal/Alertness: Awake/alert Orientation Level: Appears intact for tasks assessed Behavior During Session: [facility] for tasks performed    Balance     End of Session PT - End of Session Equipment Utilized During Treatment: Gait belt Activity Tolerance: Patient limited by fatigue Patient left: in chair;with call bell/phone within reach;with family/visitor present Nurse Communication: Mobility status   GP     Page, Irving Burton  Ann 09/12/2012, 12:48 PM

## 2012-09-13 LAB — BASIC METABOLIC PANEL
Chloride: 115 mEq/L — ABNORMAL HIGH (ref 96–112)
Creatinine, Ser: 0.81 mg/dL (ref 0.50–1.35)
GFR calc Af Amer: >90 mL/min (ref >90)

## 2012-09-13 LAB — CBC WITH DIFFERENTIAL/PLATELET
Basophils Absolute: 0 10*3/uL (ref 0.0–0.1)
HCT: 24.2 % — ABNORMAL LOW (ref 39.0–52.0)
Lymphocytes Relative: 17 % (ref 12–46)
Monocytes Absolute: 0.5 10*3/uL (ref 0.1–1.0)
Neutro Abs: 3.3 10*3/uL (ref 1.7–7.7)
Platelets: 148 10*3/uL — ABNORMAL LOW (ref 150–400)
RDW: 17.7 % — ABNORMAL HIGH (ref 11.5–15.5)
WBC: 4.8 10*3/uL (ref 4.0–10.5)

## 2012-09-13 MED ORDER — FREE WATER
120.0000 mL | Freq: Three times a day (TID) | Status: DC
  Administered 2012-09-13 – 2012-09-14 (×3): 120 mL

## 2012-09-13 MED ORDER — PANTOPRAZOLE SODIUM 40 MG PO PACK: 40.0000 mg | PACK | Freq: Two times a day (BID) | ORAL | Status: DC

## 2012-09-13 MED ORDER — PANTOPRAZOLE SODIUM 40 MG PO PACK
40.0000 mg | PACK | Freq: Two times a day (BID) | ORAL | Status: DC
  Administered 2012-09-13 – 2012-09-14 (×2): 40 mg
  Filled 2012-09-13 (×3): qty 20

## 2012-09-13 MED ORDER — AMLODIPINE BESYLATE 5 MG PO TABS
5.0000 mg | ORAL_TABLET | Freq: Every day | ORAL | Status: DC
  Administered 2012-09-13 – 2012-09-14 (×2): 5 mg via ORAL
  Filled 2012-09-13 (×2): qty 1

## 2012-09-13 NOTE — Care Management Note (Signed)
    Page 1 of 2   09/14/2012     2:13:35 PM   CARE MANAGEMENT NOTE 09/14/2012  Patient:  Corey [name], Corey [name]   Account Number:  [id number]  Date Initiated:  09/11/2012  Documentation initiated by:  DAVIS,RHONDA  Subjective/Objective Assessment:   patient with hgb 7.4 and weakness, active gi bld transfused and endo prepared.     Action/Plan:   from home   Anticipated DC Date:  09/14/2012   Anticipated DC Plan:  HOME W HOME HEALTH SERVICES  In-house referral  NA      DC Planning Services  CM consult  Medication Assistance      [facility] Choice  NA   Choice offered to / List presented to:  C-1 Patient   DME arranged  Levan Hurst      DME agency  [facility]     HH arranged  HH-2 PT  HH-3 OT      [facility]  [facility]   Status of service:  Completed, signed off Medicare Important Message given?  NA - LOS <3 / Initial given by admissions (If response is "NO", the following Medicare IM given date fields will be blank) Date Medicare IM given:   Date Additional Medicare IM given:    Discharge Disposition:  HOME W HOME HEALTH SERVICES  Per UR Regulation:  Reviewed for med. necessity/level of care/duration of stay  If discussed at Long Length of Stay Meetings, dates discussed:    Comments:  09/14/2012  Konrad Felix RN, case manager   [phone] Notified this morning patient was discharged and nurse wondering where are his medications. Directed nurse to CM note in chart on several occasions to explain that medication was in the pharmacy.Directed nurse to retrieve the medication for the patient after I was instructed to confirm that pharmacy had the medication.  09/13/12 KATHY MAHABIR RN,BSN NCM [phone] QUALIFIES FOR INDIGENT FUNDS,SINCE HIS PHARMACY DOES NOT HAVE MED,& REQUIRED PRIOR AUTH THAT ONLY PCP COULD SATISFY FOR D/C TOMORROW FOR THE NEEDED MED.SCRIPT SENT TO PHARMACY,COPY ON CHART,SPOKE TO MISTY WHO IS AWARE OF D/C IN AM.[facility] KIRSTEN(LIASON)AWARE OF  REFERRAL FOR HHRN/PT/OT,RW.PATIENT ALREADY HAS CANE.ALL CONCERNS & QUES ANSWERED.WILL NEED HH ORDERS/DME,& F2F.  [id number] Earlene Plater, RN, BSN, CCM: CHART REVIEWED AND UPDATED. NO DISCHARGE NEEDS PRESENT AT THIS TIME. CASE MANAGEMENT [phone]

## 2012-09-13 NOTE — Progress Notes (Signed)
Physical Therapy Treatment Patient Details Name: Corey [name] MRN: [id number] DOB: 1929-08-15 Today's Date: 09/13/2012 Time: 7829-5621 PT Time Calculation (min): 25 min  PT Assessment / Plan / Recommendation Comments on Treatment Session  Pt moved to telementry from ICU.  Spouse present during session and very helpful with pt's needs.  Pt eager to get up and move (motivated).  Pt progressing well, believe pt would be fine to D/C to home.    Follow Up Recommendations  Home health PT    Barriers to Discharge        Equipment Recommendations  Rolling walker with 5" wheels;Cane    Recommendations for Other Services    Frequency Min 3X/week   Plan Discharge plan remains appropriate    Precautions / Restrictions Precautions Precautions: Fall Precaution Comments: dysphagia w/ PEG placement Restrictions Weight Bearing Restrictions: No    Pertinent Vitals/Pain No c/o pain    Mobility  Bed Mobility Bed Mobility: Supine to Sit;Sitting - Scoot to Edge of Bed Supine to Sit: 5: Supervision;4: Min guard Sitting - Scoot to [place] of Bed: 5: Supervision;4: Min guard Details for Bed Mobility Assistance: Somewhat impulsive (eager)  Transfers Transfers: Sit to Stand;Stand to Sit Sit to Stand: 4: Min guard;5: Supervision;From bed Stand to Sit: 4: Min guard;5: Supervision Details for Transfer Assistance: <25% VC's on safety due to impulsive but eager to get up and moving. <25% VC's on hand placement prior to sit to control decend.  Ambulation/Gait Ambulation/Gait Assistance: 4: Min guard Ambulation Distance (Feet): 245 Feet Assistive device: Rolling walker Ambulation/Gait Assistance Details: quick gait speed but no LOB <25% VC's on safety with turns using AD and backward gait completion prior to sit. Gait Pattern: Step-through pattern;Narrow base of support Gait velocity: <25% VC's to reduce gait speed to increase safety. General Gait Details: Pt pleased to get up and move.     PT Goals  ______________________ progressing    Visit Information  Last PT Received On: 09/13/12 Assistance Needed: +1    Subjective Data   "Feels good to get out of bed"   Cognition    AxOx4   Balance   fair+  End of Session PT - End of Session Equipment Utilized During Treatment: Gait belt Patient left: in chair;with chair alarm set;with call bell/phone within reach;with family/visitor present Nurse Communication: Mobility status (staff observed)   Felecia Shelling  PTA [facility]  Acute  Rehab Pager     [phone]

## 2012-09-13 NOTE — Progress Notes (Signed)
PROGRESS NOTE  Corey [name] ZOX:[id number] DOB: [birth year] DOA: 09/10/2012 PCP: Hoyle Sauer, MD  Brief narrative: Pleasant 78 year old male with history of achalasia cardia of the esophagus status post PEG placement 02/2011 by a general surgeon presented to [facility] long emergency room was noted hypotension and bleeding 09/10/2012 after cardiology office visit. Patient was transfused 2 units packed red blood cells 10/1/2 2013 and is scheduled for an upper endoscopy and further gastroenterology followup today  Past medical history-As per Problem list Chart reviewed as below-  Admission 09/24/2003 c vascualr seeing for Infrarenal Abdominal Aortic Aneurysm, Bilateral iliac, aneurysm, single widely patent bilateral arteries  Admit for repair of infrarenal abd aneurysm 10/08/03-had ischemic leg as well, developed vent dependant resp failure  Admit 09/19/2005 c mod Coronary artery atheroscleorosis 20-30%, 40-50% narrowing  Admit 08/07/07 with "food impaction" by GI-found to have achalasia.  Admission for Urinary retention by Dr. Patsi Sears 12/26/07-TURP  Seen in ED by Dr. Parks Ranger PEG placed 02/2011 by Dr. Derrell Lolling (gen surg) tube placement at that time-bleeding noted as well c PEg tube then that was replaced with a larger sized one-Endoscopy at that time showed distal esophageal diverticula c small pressure-related isch ulcer  Severe AI noted on ECHO  Office visit 09/10/12 noted hypotension and some bleeding  Consultants:  Gastroenterology Dr. Dulce Sellar  Procedures:  None at present  Transfused 2 units packed red blood cells 09/10/12  Upper endoscopy 10 2013 = large esophageal diverticulum 2 cm  function, duodenal ulcer actively bleeding visible vessel, highly likely source of acute blood loss anemia.  Antibiotics:  None   Subjective  Feels much stronger after blood transfusion.  1 dark stool, then normal stool NO cp/n/v Wife requests Rx for Protonix so she can call compounding  pharmacy ambulant    Objective    Interim History: Reviewed completely the chart  Telemetry: Sinus rhythm with some PVCs-tele d/c 10/4  Objective: Filed Vitals:   09/12/12 1200 09/12/12 1856 09/12/12 2114 09/13/12 0500  BP:  122/52 137/58 135/57  Pulse:  69 72 75  Temp: 98.5 F (36.9 C) 97.9 F (36.6 C) 98.6 F (37 C) 98.7 F (37.1 C)  TempSrc: Oral  Oral Oral  Resp:  16 16 16   Height:      Weight:    66.7 kg (147 lb 0.8 oz)  SpO2:  98% 97% 97%    Intake/Output Summary (Last 24 hours) at 09/13/12 1422 Last data filed at 09/12/12 2132  Gross per 24 hour  Intake      3 ml  Output      0 ml  Net      3 ml    Exam:  General: Alert and pleasant male looking younger than stated age, some pallor no icterus, dentures Cardiovascular: S1 S2 grade 3/6 murmur right upper sternal left upper sternal edge and at least one other murmur in the left fifth intercostal space. Respiratory: Medically clear no added sound Abdomen: Of nontender nondistended with midline lower abdominal scar PEG tube in situ however some oozing around the inner skin. Skin see above Neuro grossly intact  Data Reviewed: Basic Metabolic Panel:  Lab 09/13/12 8119 09/11/12 1012 09/11/12 0305 09/10/12 1410 09/10/12 0947  NA 144 143 142 144 144  K 3.5 3.9 -- -- --  CL 115* 113* 112 111 113*  CO2 22 21 22 25 25   GLUCOSE 83 82 83 93 89  BUN 31* 37* 39* 41* 41*  CREATININE 0.81 0.74 0.70 0.73 0.8  CALCIUM 7.7* 8.3*  8.0* 8.8 8.6  MG -- -- -- 2.7* --  PHOS -- -- -- -- --   Liver Function Tests:  Lab 09/11/12 1012 09/11/12 0305  AST 25 22  ALT 24 21  ALKPHOS 116 103  BILITOT 0.9 0.7  PROT 5.0* 4.5*  ALBUMIN 2.8* 2.6*   No results found for this basename: LIPASE:5,AMYLASE:5 in the last 168 hours No results found for this basename: AMMONIA:5 in the last 168 hours CBC:  Lab 09/13/12 0420 09/12/12 1414 09/12/12 0310 09/11/12 1012 09/11/12 0305 09/10/12 1410 09/10/12 0947  WBC 4.8 5.6 3.8* 5.5 4.7  -- --  NEUTROABS 3.3 -- 2.8 4.1 -- 3.9 4.5  HGB 7.6* 8.8* 7.4* 8.6* 7.8* -- --  HCT 24.2* 27.7* 23.6* 27.3* 24.5* -- --  MCV 97.2 97.9 98.3 98.6 98.4 -- --  PLT 148* 180 124* 156 146* -- --   Cardiac Enzymes: No results found for this basename: CKTOTAL:5,CKMB:5,CKMBINDEX:5,TROPONINI:5 in the last 168 hours BNP: No components found with this basename: POCBNP:5 CBG: No results found for this basename: GLUCAP:5 in the last 168 hours  Recent Results (from the past 240 hour(s))  MRSA PCR SCREENING     Status: Normal   Collection Time   09/10/12  7:53 PM      Component Value Range Status Comment   MRSA by PCR NEGATIVE  NEGATIVE Final   URINE CULTURE     Status: Normal   Collection Time   09/11/12  2:35 AM      Component Value Range Status Comment   Specimen Description URINE, CLEAN CATCH   Final    Special Requests NONE   Final    Culture  Setup Time 09/11/2012 11:29   Final    Colony Count NO GROWTH   Final    Culture NO GROWTH   Final    Report Status 09/12/2012 FINAL   Final      Studies:              All Imaging reviewed and is as per above notation   Scheduled Meds:    . influenza  inactive virus vaccine  0.5 mL Intramuscular Tomorrow-1000  . pantoprazole sodium  40 mg Per Tube BID  . sodium chloride  3 mL Intravenous Q12H  . TwoCal HN  237 mL Oral 5 X Daily   Continuous Infusions:    . DISCONTD: sodium chloride 50 mL/hr at 09/12/12 2023  . DISCONTD: sodium chloride    . DISCONTD: pantoprozole (PROTONIX) infusion 8 mg/hr (09/13/12 0313)     Assessment/Plan: 1. Severe blood loss anemia-status post 2 units packed red blood cells. Hemoglobin stabilized  He had a relatively dark stool yesterday, so careful monitoring is needed.  2. TCP-Patient also has a moderate thrombocytopenia which I suspect is likely secondary to pure red cell loss from anemia blood loss and I will not work this up at this time-if this persists and platelets drop below 100 we will get  platelets 3. Likely upper GI bleed from PEG associated gastric ulcer/achalasia cardia with large ulcer-per gastroenterology-appreciate input. Patient would probably benefit from mesenteric angiogram and embolization if this recurs 4. History severe aortic insufficiency/regurgitation-not a candidate for general surgery at this time-he is to be medically managed from what I am able to gather 5. History of CHF with grade 1 diastolic dysfunction EF is 55-60% 01/10/2012-euvolemic and compensated at this time 6. History of AAA surgery-stable at this time-outpatient surveillance per primary care physician 7. Moderate renal insufficiency-P. and creatinine  ratio elevated at 40-1. Repeat be met in the morning. Secondary to UGIB and Depletion and poor by mouth intake. 8. Malnutrition-the abdomen level is 2.8 total protein 5.0. We'll get nutrition consult-recommended TwoCal HN 1 can 5 times per day per PEG When IV fluid d/c'ed, rec free water per PEG 120 ml after each feeding 9. Hypertension-at present time hold irbesartan 150 mg daily and mild to moderate hypotension-will add low dose CCB norvasc 5 mg 10. Hyperlipidemia-outpatient followup  Code Status: Full Family Communication: Discussed with wife in detail at bedside plan of care and hopefully course of events-transfer to telemetry today Disposition Plan: Step down today likely transferred to a general medical telemetry floor tomorrow-will need IV PPI therapy over the next 24-48 hours and potentially may need blood transfusion  Pleas Koch, MD  [facility] Pager [phone] 09/13/2012, 2:22 PM    LOS: 3 days

## 2012-09-13 NOTE — Progress Notes (Signed)
Subjective: Dark stool early this morning, followed by large much lighter stool later this morning. No abdominal pain. Tolerating PEG feeds.  Objective: Vital signs in last 24 hours: Temp:  [97.9 F (36.6 C)-98.7 F (37.1 C)] 98.7 F (37.1 C) (10/04 0500) Pulse Rate:  [69-75] 75  (10/04 0500) Resp:  [16] 16  (10/04 0500) BP: (122-137)/(52-58) 135/57 mmHg (10/04 0500) SpO2:  [97 %-98 %] 97 % (10/04 0500) Weight:  [66.7 kg (147 lb 0.8 oz)] 66.7 kg (147 lb 0.8 oz) (10/04 0500) Weight change: -0.1 kg (-3.5 oz) Last BM Date:  (PTA)  PE: GEN:  NAD ABD:  Soft  Lab Results: CBC    Component Value Date/Time   WBC 4.8 09/13/2012 0420   RBC 2.49* 09/13/2012 0420   HGB 7.6* 09/13/2012 0420   HCT 24.2* 09/13/2012 0420   PLT 148* 09/13/2012 0420   MCV 97.2 09/13/2012 0420   MCH 30.5 09/13/2012 0420   MCHC 31.4 09/13/2012 0420   RDW 17.7* 09/13/2012 0420   LYMPHSABS 0.8 09/13/2012 0420   MONOABS 0.5 09/13/2012 0420   EOSABS 0.3 09/13/2012 0420   BASOSABS 0.0 09/13/2012 0420   Assessment:  1.  Bleeding duodenal ulcer with endoscopic therapy applied 09/11/12.   2.  Anemia.  Suggest most is equilibration and dilutional.  Suspect dark stool today is old blood.  I don't suspect any active bleeding.  Plan:  1.  Change Protonix drip to suspension (to be given per PEG bid). 2.  Follow CBC. 3.  If persistent drop in Hgb over the next 24-48 hours, may have to consider repeat endoscopy.  If rampant rebleeding, would best benefit from Interventional Radiology consultation for consideration of angiogram with possible embolization. 4.  Will follow.  If Hgb remains stable and stool continues to stay lighter, might consider discharge home sometime over the weekend with close outpatient follow-up and CBC check.   Corey [name] 09/13/2012, 11:16 AM

## 2012-09-13 NOTE — Progress Notes (Signed)
Occupational Therapy Treatment Patient Details Name: Corey [name] MRN: [id number] DOB: 08-29-1929 Today's Date: 09/13/2012 Time: 2130-8657 OT Time Calculation (min): 33 min  OT Assessment / Plan / Recommendation Comments on Treatment Session      Follow Up Recommendations  Home health OT    Barriers to Discharge       Equipment Recommendations  Rolling walker with 5" wheels;Cane:  Pt/wife want information about whether insurance will cover DME.  Pt needs RW for safety at this time.    Recommendations for Other Services    Frequency     Plan      Precautions / Restrictions Precautions Precautions: Fall Precaution Comments: dysphagia w/ PEG placement Restrictions Weight Bearing Restrictions: No   Pertinent Vitals/Pain No c/o    ADL  Lower Body Bathing: Performed;Minimal assistance Where Assessed - Lower Body Bathing: Supported sit to stand Toilet Transfer: Performed;Min guard Statistician Method: Sit to Barista: Raised toilet seat with arms (or 3-in-1 over toilet) Equipment Used: Rolling walker Transfers/Ambulation Related to ADLs: Pt lost balance once when standing at sink.  When walking back to bed, "he parked" walker rather than turning it completely with him.  Reinforced need to keep walker right in front of him at all times for safety.  Pt has not used a walker before this admission. ADL Comments: Pt had been sitting up in chair for a long time and wished to bathe before returning to bed.  Did not have him gather items today due to fatique.      OT Diagnosis:    OT Problem List:   OT Treatment Interventions:     OT Goals Acute Rehab OT Goals Time For Goal Achievement: 09/25/12 Potential to Achieve Goals: Good ADL Goals Pt Will Perform Grooming: with supervision;Standing at sink ADL Goal: Grooming - Progress: Progressing toward goals Pt Will Perform Lower Body Bathing: with supervision;Sit to stand from chair ADL Goal: Lower Body Bathing  - Progress: Progressing toward goals Pt Will Transfer to Toilet: with supervision;Ambulation;Comfort height toilet ADL Goal: Toilet Transfer - Progress: Progressing toward goals  Visit Information  Last OT Received On: 09/13/12 Assistance Needed: +1    Subjective Data      Prior Functioning       Cognition  Overall Cognitive Status: Appears within functional limits for tasks assessed/performed Arousal/Alertness: Awake/alert Orientation Level: Appears intact for tasks assessed Behavior During Session: [facility] for tasks performed    Mobility  Shoulder Instructions Transfers Sit to Stand: 4: Min guard Details for Transfer Assistance: cues to scoot forward       Exercises      Balance     End of Session OT - End of Session Activity Tolerance: Patient limited by fatigue Patient left: in bed;with call bell/phone within reach;with family/visitor present  GO     Donna Silverman 09/13/2012, 3:00 PM Marica Otter, OTR/L [phone] 09/13/2012

## 2012-09-14 LAB — CBC
HCT: 25.1 % — ABNORMAL LOW (ref 39.0–52.0)
Hemoglobin: 7.8 g/dL — ABNORMAL LOW (ref 13.0–17.0)
RBC: 2.59 MIL/uL — ABNORMAL LOW (ref 4.22–5.81)
WBC: 5 10*3/uL (ref 4.0–10.5)

## 2012-09-14 MED ORDER — PANTOPRAZOLE SODIUM 40 MG PO PACK: 40.0000 mg | PACK | Freq: Two times a day (BID) | ORAL | Status: DC

## 2012-09-14 MED ORDER — FREE WATER: 120.0000 mL | Freq: Three times a day (TID) | Status: AC

## 2012-09-14 MED ORDER — AMLODIPINE BESYLATE 5 MG PO TABS: 5.0000 mg | ORAL_TABLET | Freq: Every day | ORAL | Status: DC

## 2012-09-14 NOTE — Discharge Summary (Signed)
Physician Discharge Summary  Corey [name] ZOX:[id number] DOB: 11-02-1929 DOA: 09/10/2012  PCP: Hoyle Sauer, MD  Admit date: 09/10/2012 Discharge date: 09/14/2012  Recommendations for Outpatient Follow-up:  1. Needs outpatient Pt/Ot and rolling walker 2. Needs CBC in 3-5 days at PCP's office-watch platelets. 3. Follow up dr. Dulce Sellar (his office will call) 4. Consider cardiac eval in 2-3 months for Aortic insuff 5. Monitor blood pressures CCB added this admit  Discharge Diagnoses:  Principal Problem:  *Anemia Active Problems:  AI (aortic insufficiency)  HTN (hypertension)  Hyperlipidemia  Hypotension  Achalasia, esophageal  Encephalopathy  GI bleed  Esophageal diverticulum  Gastric ulcer    Brief narrative:  Pleasant 78 year old male with history of achalasia cardia of the esophagus status post PEG placement 02/2011 by a general surgeon presented to [facility] long emergency room was noted hypotension and bleeding 09/10/2012 after cardiology office visit. Patient was transfused 2 units packed red blood cells 10/1/2 2013-please see below  Assessment/Plan:  1. Severe blood loss anemia-status post 2 units packed red blood cells. Hemoglobin this morning was 7.8- Patient also has a moderate thrombocytopenic-cleared by GI for outpatient re-eval 2. Likely upper GI bleed from PEG associated gastric ulcer/achalasia cardia with large ulcer-per gastroenterology-appreciate input. Patient did well with this and did not re-bleed.  If he bleeds in future per Dr. Dulce Sellar may benefit from Embolization>>>Endoscopy 3. History severe aortic insufficiency/regurgitation-not a candidate for general surgery at this time-he is to be medically managed from what I am able to gather-consider closer Cardiac follow up as outpatient please 4. History of CHF with grade 1 diastolic dysfunction EF is 55-60% 01/10/2012-euvolemic and compensated at this time 5. History of AAA surgery-stable at this time-outpatient  surveillance per primary care physician 6. Moderate renal insufficiency-P. and creatinine ratio elevated at 40-1 on admit-ARB d/c'd proabably secondary to Depletion and poor by mouth intake. 7. Malnutrition-the abdomen level is 2.8 total protein 5.0. Nutritionist recommended free water 120 flushes between cans 8. TCP-consider re-eval and plateelt smear as outpatient if remains low 9. Hypertension-at present time hold irbesartan 150 mg daily and mild to moderate hypotension 10. Hyperlipidemia-outpatient followup  Discharge Condition: good  Diet recommendation: regular  Filed Weights   09/12/12 0400 09/13/12 0500 09/14/12 0500  Weight: 66.8 kg (147 lb 4.3 oz) 66.7 kg (147 lb 0.8 oz) 68.1 kg (150 lb 2.1 oz)     Past medical history-As per Problem list  Chart reviewed as below-  Admission 09/24/2003 c vascualr seeing for Infrarenal Abdominal Aortic Aneurysm, Bilateral iliac, aneurysm, single widely patent bilateral arteries  Admit for repair of infrarenal abd aneurysm 10/08/03-had ischemic leg as well, developed vent dependant resp failure  Admit 09/19/2005 c mod Coronary artery atheroscleorosis 20-30%, 40-50% narrowing  Admit 08/07/07 with "food impaction" by GI-found to have achalasia.  Admission for Urinary retention by Dr. Patsi Sears 12/26/07-TURP  Seen in ED by Dr. Parks Ranger PEG placed 02/2011 by Dr. Derrell Lolling (gen surg) tube placement at that time-bleeding noted as well c PEg tube then that was replaced with a larger sized one-Endoscopy at that time showed distal esophageal diverticula c small pressure-related isch ulcer  Severe AI noted on ECHO  Office visit 09/10/12 noted hypotension and some bleeding  Consultants:  Gastroenterology Dr. Dulce Sellar  Procedures:  None at present  Transfused 2 units packed red blood cells 09/10/12  Upper endoscopy 09/11/12 = large esophageal diverticulum 2 cm function, duodenal ulcer actively bleeding visible vessel, highly likely source of acute blood loss  anemia.  Antibiotics:  None  Discharge Exam: Filed Vitals:   09/14/12 0559 09/14/12 0600 09/14/12 0602 09/14/12 0605  BP: 141/62 141/61 133/61 143/54  Pulse: 77 82 81 83  Temp: 98.3 F (36.8 C)     TempSrc: Oral     Resp: 18 18 18 18   Height:      Weight:      SpO2: 97% 98% 98% 98%    General: Alert and pleasant male looking younger than stated age, some pallor no icterus, dentures  Cardiovascular: S1 S2 grade 3/6 murmur right upper sternal left upper sternal edge and at least one other murmur in the left fifth intercostal space.  Respiratory: Medically clear no added sound  Abdomen: Of nontender nondistended with midline lower abdominal scar PEG tube in situ however some oozing around the inner skin.  Skin see above  Neuro grossly intact    Discharge Instructions  Discharge Orders    Future Appointments: Provider: Department: Dept Phone: Center:   12/13/2012 9:30 AM Rosalio Macadamia, NP [facility] [phone] [facility]     Future Orders Please Complete By Expires   Walker rolling      Diet - low sodium heart healthy      Increase activity slowly      Call MD for:  severe uncontrolled pain      Call MD for:  redness, tenderness, or signs of infection (pain, swelling, redness, odor or green/yellow discharge around incision site)      Call MD for:  extreme fatigue      Home Health      Questions: Responses:   To provide the following care/treatments OT    PT   Face-to-face encounter      Comments:   I [facility] certify that this patient is under my care and that I, or a nurse practitioner or physician's assistant working with me, had a face-to-face encounter that meets the physician face-to-face encounter requirements with this patient on 09/14/2012.   Questions: Responses:   The encounter with the patient was in whole, or in part, for the following medical condition, which is the primary reason for home health care gi bleed   I certify that,  based on my findings, the following services are medically necessary home health services Physical therapy   My clinical findings support the need for the above services Complex treatment plan/patient with lack knowledge disease process and treatment   Further, I certify that my clinical findings support that this patient is homebound due to: Unsafe ambulation due to balance issues   To provide the following care/treatments OT    PT       Medication List     As of 09/14/2012 11:27 AM    STOP taking these medications         acetaminophen 500 MG tablet   Commonly known as: TYLENOL      aspirin 81 MG tablet      irbesartan 150 MG tablet   Commonly known as: AVAPRO      loratadine 10 MG tablet   Commonly known as: CLARITIN      TwoCal HN Liqd      TAKE these medications         amLODipine 5 MG tablet   Commonly known as: NORVASC   Take 1 tablet (5 mg total) by mouth daily.      free water Soln   Place 120 mLs into feeding tube every 8 (eight) hours.      pantoprazole sodium  40 mg/20 mL Pack   Commonly known as: PROTONIX   Place 20 mLs (40 mg total) into feeding tube 2 (two) times daily.            Follow-up Information    Follow up with Hoyle Sauer, MD. In 3 days.   Contact information:   [address] [facility] [place] [address] [phone]       Follow up with [name], MD. In 1 month.   Contact information:   [address] [place] [address] [phone]           The results of significant diagnostics from this hospitalization (including imaging, microbiology, ancillary and laboratory) are listed below for reference.    Significant Diagnostic Studies: Ir Radiologist Eval & Mgmt  09/03/2012  *RADIOLOGY REPORT*  Clinical Data: Problems with gastric tube.  ESTABLISHED PATIENT OFFICE VISIT:  Level II [id number]  History of present illness:  Mr. Berendt presents today for reevaluation of his  gastrostomy tube.  He was last seen on 07/16/2012 when a 24-French balloon retention gastric tube was exchanged due to the initial one falling out of place.  The patient had been seen also for redness at maceration of the skin surrounding the tube and was placed on Nystatin powder and oral diflucan with initial improvement.  The patient continues to have trouble with leakage and skin irritation around the tube now that this regimen has been completed.  Exam: The gastric tube is intact and is flushed easily with out any pain to the patient or evidence of leakage around the insertion site.  The bumper was noted to be approximately 3 cm away from the skin allowing for gastric drainage to continue to leak which is evident upon exam.  The area circumferentially is red, macerated and tender. His stomach is soft and nondistended with positive bowel sounds. Unfortunately the gastric tube was placed immediately under the lower portion of the patient's rib cage and appears to be within a skin crease of the patient's abdomen promoting a moisturous environment.  Patient and spouse informed this is going to be a chronic ongoing issue we need to get a handle on.  Assessment plan:  Prior treatment regimen was reviewed with the patient and his spouse at today's setting.  The spouse who takes care of the gastric tube was informed that the bumper must remain as close to the skin as possible to prevent further gastric juice leakage which continues to be a problem promoting skin maceration. The use of hydrogen peroxide to cleanse the skin as well as the bumper is to be used approximately twice a day to keep the area clean and promote dryness.  She is to use Nystatin powder in the morning along with a topical antibiotic and should remain covered with a small amount of gauze throughout the day and changed as necessary to keep skin dry.  In the evenings to cleanse again with hydrogen peroxide and to apply Maalox to the skin which will  allow a skin protective barrier. She was also informed that the patient should allow out this area to air out as much as possible when he is at home and sitting around and to keep loosely the gauze fixed under the bumper. Written instructions were provided to her to make sure she understood the plan. They are to contact us within the next week to let us know if there been any benefits with this regimen or worsening of symptoms.  Total time spent with the patient was approximately 20 minutes.  Read by: Anselm Pancoast, P.A.-C   Original Report Authenticated By: Waynard Reeds, M.D.     Microbiology: Recent Results (from the past 240 hour(s))  MRSA PCR SCREENING     Status: Normal   Collection Time   09/10/12  7:53 PM      Component Value Range Status Comment   MRSA by PCR NEGATIVE  NEGATIVE Final   URINE CULTURE     Status: Normal   Collection Time   09/11/12  2:35 AM      Component Value Range Status Comment   Specimen Description URINE, CLEAN CATCH   Final    Special Requests NONE   Final    Culture  Setup Time 09/11/2012 11:29   Final    Colony Count NO GROWTH   Final    Culture NO GROWTH   Final    Report Status 09/12/2012 FINAL   Final      Labs: Basic Metabolic Panel:  Lab 09/13/12 1610 09/11/12 1012 09/11/12 0305 09/10/12 1410 09/10/12 0947  NA 144 143 142 144 144  K 3.5 3.9 3.7 4.0 3.9  CL 115* 113* 112 111 113*  CO2 22 21 22 25 25   GLUCOSE 83 82 83 93 89  BUN 31* 37* 39* 41* 41*  CREATININE 0.81 0.74 0.70 0.73 0.8  CALCIUM 7.7* 8.3* 8.0* 8.8 8.6  MG -- -- -- 2.7* --  PHOS -- -- -- -- --   Liver Function Tests:  Lab 09/11/12 1012 09/11/12 0305  AST 25 22  ALT 24 21  ALKPHOS 116 103  BILITOT 0.9 0.7  PROT 5.0* 4.5*  ALBUMIN 2.8* 2.6*   No results found for this basename: LIPASE:5,AMYLASE:5 in the last 168 hours No results found for this basename: AMMONIA:5 in the last 168 hours CBC:  Lab 09/14/12 0517 09/13/12 0420 09/12/12 1414 09/12/12 0310 09/11/12 1012  09/10/12 1410 09/10/12 0947  WBC 5.0 4.8 5.6 3.8* 5.5 -- --  NEUTROABS -- 3.3 -- 2.8 4.1 3.9 4.5  HGB 7.8* 7.6* 8.8* 7.4* 8.6* -- --  HCT 25.1* 24.2* 27.7* 23.6* 27.3* -- --  MCV 96.9 97.2 97.9 98.3 98.6 -- --  PLT 148* 148* 180 124* 156 -- --   Cardiac Enzymes: No results found for this basename: CKTOTAL:5,CKMB:5,CKMBINDEX:5,TROPONINI:5 in the last 168 hours BNP: BNP (last 3 results)  Basename 01/05/12 1015  PROBNP 150.0*   CBG: No results found for this basename: GLUCAP:5 in the last 168 hours  Time coordinating discharge: 40 minutes  Signed:  Rhetta Mura  [facility] 09/14/2012, 11:18 AM

## 2012-09-14 NOTE — Progress Notes (Signed)
Subjective: No further bleeding.  Objective: Vital signs in last 24 hours: Temp:  [97.7 F (36.5 C)-98.3 F (36.8 C)] 98.3 F (36.8 C) (10/05 0559) Pulse Rate:  [77-83] 83  (10/05 0605) Resp:  [18] 18  (10/05 0605) BP: (129-143)/(54-62) 143/54 mmHg (10/05 0605) SpO2:  [97 %-98 %] 98 % (10/05 0605) Weight:  [68.1 kg (150 lb 2.1 oz)] 68.1 kg (150 lb 2.1 oz) (10/05 0500) Weight change: 1.4 kg (3 lb 1.4 oz) Last BM Date:  (PTA)  PE: GEN:  NAD ABD:  Soft; PEG in place  Lab Results: CBC    Component Value Date/Time   WBC 5.0 09/14/2012 0517   RBC 2.59* 09/14/2012 0517   HGB 7.8* 09/14/2012 0517   HCT 25.1* 09/14/2012 0517   PLT 148* 09/14/2012 0517   MCV 96.9 09/14/2012 0517   MCH 30.1 09/14/2012 0517   MCHC 31.1 09/14/2012 0517   RDW 17.3* 09/14/2012 0517   LYMPHSABS 0.8 09/13/2012 0420   MONOABS 0.5 09/13/2012 0420   EOSABS 0.3 09/13/2012 0420   BASOSABS 0.0 09/13/2012 0420   Assessment:  1.  Melena with acute blood loss anemia.  No further bleeding, hemoglobin stable. 2.  Bleeding duodenal ulcer, cause of #1 above, likely NSAID-related.  No further bleeding.  Plan:  1.  OK to discharge today from GI perspective. 2.  Needs PPI suspension per PEG bid x 6 weeks, then qd indefinitely thereafter; we will given omeprazole suspension 40 mg per PEG bid.  I have discussed with [facility], and they will make sure this is ready to pick up today upon discharge. 3.  Needs CBC rechecked next week. 4.  Will sign-off; please call with questions.  Thank you for the consult.   [name] 09/14/2012, 10:16 AM

## 2012-09-16 ENCOUNTER — Telehealth: Payer: Self-pay | Admitting: Cardiology

## 2012-09-16 DIAGNOSIS — I10 Essential (primary) hypertension: Secondary | ICD-10-CM

## 2012-09-16 DIAGNOSIS — I359 Nonrheumatic aortic valve disorder, unspecified: Secondary | ICD-10-CM

## 2012-09-16 NOTE — Telephone Encounter (Signed)
Pt's wife notified. Pt will return for BMET 10/01/12.

## 2012-09-16 NOTE — Telephone Encounter (Signed)
New problem:  Need to discuss medication that was changed in the hospital.

## 2012-09-16 NOTE — Telephone Encounter (Signed)
Would take avapro 75 mg bid.  BMET in 2 wks

## 2012-09-16 NOTE — Telephone Encounter (Signed)
Spoke with pt's wife. Pt saw Sunday Spillers 09/10/12. At that time  avapro was decreased to 75 mg bid from avapro 150mg  bid. Pt was told not to take avapro if BP less than 100. Pt has since been  hospitalized with GI bleed. Wife states at discharge  he was told to take norvasc 5mg  daily. Pt was told to discontinue aspirin. He was discharged on protonix also. Wife states pt has not had either avapro or norvasc since Saturday. His BP this morning was 120/50. Pt's wife is asking if pt should take avapro or norvasc. I will forward to Dr [name] for review.

## 2012-09-30 ENCOUNTER — Ambulatory Visit: Payer: Medicare Other | Admitting: Nurse Practitioner

## 2012-10-01 ENCOUNTER — Other Ambulatory Visit (INDEPENDENT_AMBULATORY_CARE_PROVIDER_SITE_OTHER): Payer: Medicare Other

## 2012-10-01 DIAGNOSIS — I359 Nonrheumatic aortic valve disorder, unspecified: Secondary | ICD-10-CM

## 2012-10-01 DIAGNOSIS — I10 Essential (primary) hypertension: Secondary | ICD-10-CM

## 2012-10-01 LAB — BASIC METABOLIC PANEL
BUN: 27 mg/dL — ABNORMAL HIGH (ref 6–23)
CO2: 27 mEq/L (ref 19–32)
Chloride: 108 mEq/L (ref 96–112)
Glucose, Bld: 124 mg/dL — ABNORMAL HIGH (ref 70–99)
Potassium: 3.8 mEq/L (ref 3.5–5.1)

## 2012-10-04 ENCOUNTER — Telehealth: Payer: Self-pay | Admitting: Cardiology

## 2012-10-04 NOTE — Telephone Encounter (Signed)
Pt would like lab results call home or [phone] you can leave a message

## 2012-10-04 NOTE — Telephone Encounter (Signed)
**Note De-Identified Ger Nicks Obfuscation** Pt. and his wife thought that the last blood work he had drawn on 10-22 was to check his A1c but he had a Bmet due to change from Norvasc to Avapro. Pt's wife states that she and the pt. are very frustrated b/c they feel that no one takes time to explain to them why lab work and other tests are ordered for pt. All cardiac questions answered during this phone call and pt's wife is advised to call pt's pcp concerning A1c, she verbalized understanding and apologized for their frustration. Pt. is advised that if there is anything we can help them with to please call office.

## 2012-12-13 ENCOUNTER — Ambulatory Visit: Payer: Medicare Other | Admitting: Nurse Practitioner

## 2012-12-23 ENCOUNTER — Ambulatory Visit: Payer: Medicare Other | Admitting: Nurse Practitioner

## 2012-12-24 ENCOUNTER — Ambulatory Visit: Payer: Medicare Other | Admitting: Nurse Practitioner

## 2013-01-09 ENCOUNTER — Ambulatory Visit (INDEPENDENT_AMBULATORY_CARE_PROVIDER_SITE_OTHER): Payer: Medicare Other | Admitting: Cardiology

## 2013-01-09 ENCOUNTER — Encounter: Payer: Self-pay | Admitting: Cardiology

## 2013-01-09 VITALS — BP 176/66 | HR 76 | Ht 69.0 in | Wt 152.0 lb

## 2013-01-09 DIAGNOSIS — I351 Nonrheumatic aortic (valve) insufficiency: Secondary | ICD-10-CM

## 2013-01-09 DIAGNOSIS — I359 Nonrheumatic aortic valve disorder, unspecified: Secondary | ICD-10-CM

## 2013-01-09 DIAGNOSIS — K259 Gastric ulcer, unspecified as acute or chronic, without hemorrhage or perforation: Secondary | ICD-10-CM

## 2013-01-09 MED ORDER — CARVEDILOL 6.25 MG PO TABS: 6.2500 mg | ORAL_TABLET | Freq: Two times a day (BID) | ORAL | Status: DC

## 2013-01-09 NOTE — Progress Notes (Signed)
Patient ID: Corey [name], male   DOB: Jan 30, 1929, 79 y.o.   MRN: [id number] PCP: Dr. Felipa [name]  79 yo with history of achalasia and PEG tube, severe aortic insufficiency, and AAA repair complicated by post-op encephalopathy presents for cardiology followup.  He has had a lot of problems with his PEG tube, but these seem to have improved more recently.  Last GI complication was a GI bleed from a gastric ulcer in the setting of NSAID use in 10/13.  This required transfusion.  Last echo in 1/13 showed moderate LV dilation with normal LV systolic function.  There was severe AI.    Patient has been stable recently in terms of symptoms.  He has been off aspirin since his GI bleed in 10/13.  He does not have peripheral edema.  He is able to walk on flat ground without much trouble but gets short of breath and fatigued walking up steps.  SBP has been in the 120s-130s at home.  His irbesartan was cut back at one point due to orthostatic symptoms.  He denies any positional dizziness now.  No chest pain.   Labs (7/12): LDL 83, HDL 76 Labs (1/13): K 3.1, creatinine 0.71, HCT 24.4 Labs (10/13): K 3.8, creatinine 0.8  PMH: 1. Mild memory deficit 2. Achalasia: Severe, has PEG tube.  3. AAA repair in 2004.  Complicated by post-op encephalopathy and prolonged hospitalization.  4. Aortic insufficiency: Echo (3/12) with EF 55-60%, grade II diastolic dysfunction, moderate AI, PA systolic pressure 36 mmHg, dilated aortic root.  Echo (1/13) with EF 55-60%, moderate LV dilation, severe aortic insufficiency with a trileaflet aortic valve, no AS, mild MR with SAM in the setting of focal basal septal hypertrophy, aortic root dilated to 4.4 cm.   5. CAD: LHC 2006 with 40-50% proximal LAD.  6. Hyperlipidemia 7. HTN 8. H/o TURP 9. PUD  SH: Lives in [place] with wife, has 4 children. Quit smoking in 1984.    FH: No premature CAD.   ROS: All systems reviewed and negative except as per HPI.   Current Outpatient Prescriptions   Medication Sig Dispense Refill  . irbesartan (AVAPRO) 75 MG tablet Pt taking one-half 150mg  tablet two times a day      . omeprazole (PRILOSEC) 40 MG capsule Take 1 tablet by mouth Daily.      . Water For Irrigation, Sterile (FREE WATER) SOLN Place 120 mLs into feeding tube every 8 (eight) hours.  120 mL  0  . carvedilol (COREG) 6.25 MG tablet Take 1 tablet (6.25 mg total) by mouth 2 (two) times daily.  180 tablet  3    BP 176/66  Pulse 76  Ht 5\' 9"  (1.753 m)  Wt 152 lb (68.947 kg)  BMI 22.45 kg/m2  SpO2 98% General: NAD Neck: No JVD, no thyromegaly or thyroid nodule.  Lungs: Clear to auscultation bilaterally with normal respiratory effort. CV: Nondisplaced PMI.  Heart regular S1/S2, no S3/S4, 2/6 SEM RUSB, 3/6 diastolic murmur along the sternal border.  No peripheral edema.  No carotid bruit.  Normal pedal pulses.  Abdomen: Soft, nontender, no hepatosplenomegaly, no distention.  Neurologic: Alert and oriented x 3.  Psych: Normal affect. Extremities: No clubbing or cyanosis.   Assessment/Plan:  AI (aortic insufficiency)  Prominent diastolic murmur with severe AI on echo and LV dilation.  EF is preserved but the patient does have exertional dyspnea that may be due to the valve lesion.  He does not look volume overloaded on exam. He would  be a poor surgical candidate given comorbidities and difficulty with past surgery (post-op encephalopathy). I talked to his wife and him at length today regarding whether or not to refer for evaluation by [facility].  They both would like non-surgical management of his disease, which I think is reasonable given his age and comorbidities. - Continue irbesartan for afterload reduction. - Add Coreg 6.25 mg bid.  This has been shown in some studies to delay progression of aortic insufficiency.  - No indication at this point for Lasix.  - Close followup: I will see him in 3 months. GI No further overt GI bleeding.  He is not having trouble currently with his PEG  tube.  He is off aspirin due to history of severe upper GI bleeding.   Corey [name] 01/09/2013

## 2013-01-09 NOTE — Patient Instructions (Addendum)
Your physician recommends that you schedule a follow-up appointment in: 3 MONTHS WITH DR [facility]  START CARVEDILOL 6.25 MG TWICE DAILY

## 2013-03-10 ENCOUNTER — Ambulatory Visit (HOSPITAL_COMMUNITY)
Admission: RE | Admit: 2013-03-10 | Discharge: 2013-03-10 | Disposition: A | Discharge status: Home / Self Care | Payer: Medicare Other | Source: Ambulatory Visit | Attending: Interventional Radiology | Admitting: Interventional Radiology

## 2013-03-10 ENCOUNTER — Other Ambulatory Visit (HOSPITAL_COMMUNITY): Payer: Self-pay | Admitting: Interventional Radiology

## 2013-03-10 DIAGNOSIS — R633 Feeding difficulties: Secondary | ICD-10-CM

## 2013-03-10 DIAGNOSIS — N3946 Mixed incontinence: Secondary | ICD-10-CM | POA: Insufficient documentation

## 2013-03-10 DIAGNOSIS — Y833 Surgical operation with formation of external stoma as the cause of abnormal reaction of the patient, or of later complication, without mention of misadventure at the time of the procedure: Secondary | ICD-10-CM | POA: Insufficient documentation

## 2013-03-10 DIAGNOSIS — K9423 Gastrostomy malfunction: Secondary | ICD-10-CM | POA: Insufficient documentation

## 2013-04-07 ENCOUNTER — Ambulatory Visit (INDEPENDENT_AMBULATORY_CARE_PROVIDER_SITE_OTHER): Payer: Medicare Other | Admitting: Cardiology

## 2013-04-07 ENCOUNTER — Encounter: Payer: Self-pay | Admitting: Cardiology

## 2013-04-07 VITALS — BP 126/62 | HR 62 | Ht 69.0 in | Wt 149.0 lb

## 2013-04-07 DIAGNOSIS — I351 Nonrheumatic aortic (valve) insufficiency: Secondary | ICD-10-CM

## 2013-04-07 DIAGNOSIS — I359 Nonrheumatic aortic valve disorder, unspecified: Secondary | ICD-10-CM

## 2013-04-07 DIAGNOSIS — K220 Achalasia of cardia: Secondary | ICD-10-CM

## 2013-04-07 DIAGNOSIS — I10 Essential (primary) hypertension: Secondary | ICD-10-CM

## 2013-04-07 NOTE — Patient Instructions (Addendum)
Increase irbesartan (avapro) to 150mg  in the AM and 75mg  in the PM. This will be 1 of your 150mg  tablets in the AM and 1/2 of your 150mg  tablets in the PM.  Your physician recommends that you return for lab work in: 1 week--BMET.   Your physician has requested that you have an echocardiogram. Echocardiography is a painless test that uses sound waves to create images of your heart. It provides your doctor with information about the size and shape of your heart and how well your heart's chambers and valves are working. This procedure takes approximately one hour. There are no restrictions for this procedure.  You have been referred to Dr Cornelius Moras at [facility] for evaluation for aortic valve replacement.   Your physician recommends that you schedule a follow-up appointment in: 4 months with Dr [name].

## 2013-04-07 NOTE — Progress Notes (Signed)
Patient ID: NHAT HEARNE, male   DOB: September 23, 1929, 79 y.o.   MRN: [id number] PCP: Dr. Felipa Eth  79 yo with history of achalasia and PEG tube, severe aortic insufficiency, and AAA repair complicated by post-op encephalopathy presents for cardiology followup.  He has had a lot of problems with his PEG tube, but these seem to have improved more recently.  Last GI complication was a GI bleed from a gastric ulcer in the setting of NSAID use in 10/13.  This required transfusion.  Last echo in 1/13 showed moderate LV dilation with normal LV systolic function.  There was severe AI.    Patient has been stable recently in terms of symptoms.  He has been off aspirin since his GI bleed in 10/13.  He does not have peripheral edema.  He is short of breath after walking about 100 yards and has to stop.  He and his wife went to [facility] over the weekend and had a good time with no problems.  SBP has been in the 130s/40s at home.  He denies any positional dizziness.  No chest pain.  He is feeling better overall.  He is very conflicted about his prior decision to not intervene surgically on his aortic valve.   ECG: NSR, 1st degree AV block, LVH, left axis deviation, nonspecific T wave changes  Labs (7/12): LDL 83, HDL 76 Labs (1/13): K 3.1, creatinine 0.71, HCT 24.4 Labs (10/13): K 3.8, creatinine 0.8  PMH: 1. Mild memory deficit 2. Achalasia: Severe, has PEG tube.  3. AAA repair in 2004.  Complicated by post-op encephalopathy and prolonged hospitalization.  4. Aortic insufficiency: Echo (3/12) with EF 55-60%, grade II diastolic dysfunction, moderate AI, PA systolic pressure 36 mmHg, dilated aortic root.  Echo (1/13) with EF 55-60%, moderate LV dilation, severe aortic insufficiency with a trileaflet aortic valve, no AS, mild MR with SAM in the setting of focal basal septal hypertrophy, aortic root dilated to 4.4 cm.   5. CAD: LHC 2006 with 40-50% proximal LAD.  6. Hyperlipidemia 7. HTN 8. H/o TURP 9. PUD  SH: Lives  in [place] with wife, has 4 children. Quit smoking in 1984.    FH: No premature CAD.   ROS: All systems reviewed and negative except as per HPI.   Current Outpatient Prescriptions  Medication Sig Dispense Refill  . carvedilol (COREG) 6.25 MG tablet Take 1 tablet (6.25 mg total) by mouth 2 (two) times daily.  180 tablet  3  . irbesartan (AVAPRO) 75 MG tablet Pt taking one-half 150mg  tablet two times a day      . OMEPRAZOLE PO Take by mouth. 5ml twice daily      . Water For Irrigation, Sterile (FREE WATER) SOLN Place 120 mLs into feeding tube every 8 (eight) hours.  120 mL  0   No current facility-administered medications for this visit.    BP 126/62  Pulse 62  Ht 5\' 9"  (1.753 m)  Wt 149 lb (67.586 kg)  BMI 21.99 kg/m2 General: NAD Neck: No JVD, no thyromegaly or thyroid nodule.  Lungs: Clear to auscultation bilaterally with normal respiratory effort. CV: Nondisplaced PMI.  Heart regular S1/S2, no S3/S4, 2/6 SEM RUSB, 3/6 diastolic murmur along the sternal border.  No peripheral edema.  No carotid bruit.  Normal pedal pulses.  Abdomen: Soft, nontender, no hepatosplenomegaly, no distention.  Neurologic: Alert and oriented x 3.  Psych: Normal affect. Extremities: No clubbing or cyanosis.   Assessment/Plan:  AI (aortic insufficiency)  Prominent  diastolic murmur with severe AI on echo and LV dilation on 1/13 echo.  EF was preserved but the patient does have exertional dyspnea that is likely due to the valve lesion.  He does not look volume overloaded on exam. Overall he is a difficult surgical candidate given comorbidities (achalasia/feeding tube) and difficulty with past surgery (post-op encephalopathy). I have talked to Mr Overbeck and his wife extensively in the past and had decided on not pursuing surgical repair of the valve.  However, his feeding tube issues have been stabilized and he is feeling better overall.  He is having second thoughts about repairing the valve.  As above, I  think risk is high. Given the dilated aortic root on prior imaging that may need repair, I am not sure that minimally invasive surgery would be an option.   - Echo to reassess aortic valve and root.  I will also refer him to [facility] to discuss surgical risks for AVR (and potential need for aortic root replacement as well).   - Continue irbesartan for afterload reduction, will increase to 150 qam, 75 qpm today with  BMET in 1 week. - Continue Coreg.  This has been shown in some studies to delay progression of aortic insufficiency.  - No indication at this point for Lasix.  - I will see him in 4 months. GI No further overt GI bleeding.  He is not having trouble currently with his PEG tube.  He is off aspirin due to history of severe upper GI bleeding.  He sees Dr. Madilyn Fireman.   Marca Ancona 04/07/2013

## 2013-04-16 ENCOUNTER — Other Ambulatory Visit (INDEPENDENT_AMBULATORY_CARE_PROVIDER_SITE_OTHER): Payer: Medicare Other

## 2013-04-16 ENCOUNTER — Ambulatory Visit (HOSPITAL_COMMUNITY): Payer: Medicare Other | Attending: Cardiology | Admitting: Radiology

## 2013-04-16 ENCOUNTER — Encounter: Payer: Self-pay | Admitting: *Deleted

## 2013-04-16 DIAGNOSIS — E785 Hyperlipidemia, unspecified: Secondary | ICD-10-CM | POA: Insufficient documentation

## 2013-04-16 DIAGNOSIS — I359 Nonrheumatic aortic valve disorder, unspecified: Secondary | ICD-10-CM

## 2013-04-16 DIAGNOSIS — Z87891 Personal history of nicotine dependence: Secondary | ICD-10-CM | POA: Insufficient documentation

## 2013-04-16 DIAGNOSIS — R0609 Other forms of dyspnea: Secondary | ICD-10-CM | POA: Insufficient documentation

## 2013-04-16 DIAGNOSIS — R0989 Other specified symptoms and signs involving the circulatory and respiratory systems: Secondary | ICD-10-CM | POA: Insufficient documentation

## 2013-04-16 DIAGNOSIS — I10 Essential (primary) hypertension: Secondary | ICD-10-CM

## 2013-04-16 LAB — BASIC METABOLIC PANEL
BUN: 28 mg/dL — ABNORMAL HIGH (ref 6–23)
Creatinine, Ser: 0.8 mg/dL (ref 0.4–1.5)
GFR: 95.7 mL/min (ref >60.00)
Glucose, Bld: 79 mg/dL (ref 70–99)

## 2013-04-16 NOTE — Progress Notes (Signed)
Echocardiogram performed.  

## 2013-04-21 ENCOUNTER — Encounter: Payer: Medicare Other | Admitting: Thoracic Surgery (Cardiothoracic Vascular Surgery)

## 2013-04-25 ENCOUNTER — Encounter: Payer: Self-pay | Admitting: Thoracic Surgery (Cardiothoracic Vascular Surgery)

## 2013-04-25 ENCOUNTER — Institutional Professional Consult (permissible substitution) (INDEPENDENT_AMBULATORY_CARE_PROVIDER_SITE_OTHER): Payer: Medicare Other | Admitting: Thoracic Surgery (Cardiothoracic Vascular Surgery)

## 2013-04-25 ENCOUNTER — Other Ambulatory Visit: Payer: Self-pay | Admitting: *Deleted

## 2013-04-25 VITALS — BP 142/68 | HR 64 | Resp 16 | Ht 68.0 in | Wt 147.0 lb

## 2013-04-25 DIAGNOSIS — I719 Aortic aneurysm of unspecified site, without rupture: Secondary | ICD-10-CM

## 2013-04-25 DIAGNOSIS — I712 Thoracic aortic aneurysm, without rupture: Secondary | ICD-10-CM

## 2013-04-25 DIAGNOSIS — I351 Nonrheumatic aortic (valve) insufficiency: Secondary | ICD-10-CM

## 2013-04-25 DIAGNOSIS — I350 Nonrheumatic aortic (valve) stenosis: Secondary | ICD-10-CM

## 2013-04-25 DIAGNOSIS — K220 Achalasia of cardia: Secondary | ICD-10-CM

## 2013-04-25 DIAGNOSIS — I359 Nonrheumatic aortic valve disorder, unspecified: Secondary | ICD-10-CM

## 2013-04-25 NOTE — Patient Instructions (Addendum)
Schedule CT scan of chest to evaluate aneurysm of ascending thoracic aorta  Schedule pulmonary function tests  Discuss with your family how aggressive you choose to be with regards to the treatment of your aneurysm and leaking aortic valve

## 2013-04-25 NOTE — Progress Notes (Signed)
[address]            [place] [address]          [phone]     CARDIOTHORACIC SURGERY CONSULTATION REPORT  Referring Provider is Laurey Morale, MD PCP is Hoyle Sauer, MD  Chief Complaint  Patient presents with  . Shortness of Breath    on exertion...AI...eval and treat  . TAA    and aortic root aneurysm    HPI:  Patient is an 79 year old gentleman from [place] with long history of aortic insufficiency for which she had been followed previously by Dr. Deborah Chalk and more recently by Dr. [name].  He has complicated past medical history with numerous other comorbid medical problems. He underwent open surgical repair of an abdominal aortic aneurysm by Dr. Madilyn Fireman in 2004. He had a very complicated postoperative course with surgical complications that required repeat surgery and subsequent ventilator-dependent respiratory failure and encephalopathy. Chest CT scan performed at that time documented the presence of aneurysmal enlargement of the ascending thoracic aorta with maximum dimensions reported 4.3 x 5.0 cm at that time.his postoperative convalescence was slow and protracted, but he ultimately recovered. More recently the patient developed problems with chronic reflux, dysphagia and weight loss for which she was ultimately diagnosed with severe achalasia with an epiphrenic diverticulum. He ultimately underwent open gastrostomy tube placement approximately 2 years ago and since then he has remained on tube feeds because of severe chronic dysphagia with inability to swallow solid foods and significant difficulty with liquids.  With tube feeds the patient has regained some weight and strength, although he has had problems with recurrent GI bleeds do to both ulceration around his feeding tube as well as a duodenal ulcer.  More recently the patient has been followed by Dr. [name] because of his long-standing aortic insufficiency. Followup echocardiogram  demonstrates severe aortic insufficiency with moderate LV chamber enlargement but preserved LV systolic function.  The patient has developed exertional shortness of breath which is slowly progressed over the last 2 years. The patient has been reluctant to consider surgical intervention, but given his recent echo findings and slowly worsening symptoms of exertional shortness of breath, the patient has now been referred to discuss surgical risks and treatment alternatives.  The patient describes significant exertional shortness of breath. At this point in time this remains his primary physical limitation. He is short of breath and tired fairly quickly. He has never had any resting shortness of breath, PND, orthopnea, or lower extremity edema. He has not had any chest pain or chest tightness. He denies any tachypalpitations, dizzy spells, or syncope. He does report decreased energy. He has chronic severe dysphagia and reflux. He cannot swallow solid foods. When he eats solid foods for satiety purposes he chews it up and spits it back out. He frequently regurgitates liquids as well. His weight has been stable using tube feedings.  He does not have pain with swallowing. He does have some intermittent coughing.  Past Medical History  Diagnosis Date  . Aortic insufficiency     severe per echo January 2013  . Achalasia, esophageal     s/p PEG tube  . Dysphagia   . Hypertension   . Encephalopathy     Post AAA surgery  . Hyperlipidemia   . Bruises easily   . PVD (peripheral vascular disease)   . AAA (abdominal aortic aneurysm)   .  GI bleed January 2013    thru PEG tube with associated mild shock  . Esophageal diverticulum 09/10/2012    Large distal esophageal diverticulum leading to feeding difficulties.  . Gastric ulcer 09/10/2012    Hx of PEG associated gastric ulcer  . Complication of anesthesia     confusion at times  . Ascending aortic aneurysm     Past Surgical History  Procedure Laterality  Date  . Cardiac catheterization  09/19/2005    NORMAL. EF 65%  . Abdominal aortic aneurysm repair    . Transurethral resection of prostate    . Hernia repair    . Peg placement  2012  . Esophagogastroduodenoscopy  12/23/2011    Procedure: ESOPHAGOGASTRODUODENOSCOPY (EGD);  Surgeon: [name], MD;  Location: [facility];  Service: Endoscopy;  Laterality: N/A;  . Esophagogastroduodenoscopy  09/11/2012    Procedure: ESOPHAGOGASTRODUODENOSCOPY (EGD);  Surgeon: Willis Modena, MD;  Location: [facility];  Service: Endoscopy;  Laterality: Left;    Family History  Problem Relation Age of Onset  . Aneurysm Mother   . Heart disease Mother   . Stomach cancer Father   . Cancer Father     colon  . Cancer Brother     lung  . Heart disease Sister     History   Social History  . Marital Status: Married    Spouse Name: N/A    Number of Children: N/A  . Years of Education: N/A   Occupational History  . Not on file.   Social History Main Topics  . Smoking status: Former Smoker    Quit date: 12/11/1982  . Smokeless tobacco: Never Used  . Alcohol Use: No  . Drug Use: No  . Sexually Active: No   Other Topics Concern  . Not on file   Social History Narrative  . No narrative on file    Current Outpatient Prescriptions  Medication Sig Dispense Refill  . carvedilol (COREG) 6.25 MG tablet Take 1 tablet (6.25 mg total) by mouth 2 (two) times daily.  180 tablet  3  . Feeding Tubes MISC by Does not apply route. Per gravity...HHN...CANS ARE GIVEN TO MAINTAIN WEIGHT      . irbesartan (AVAPRO) 150 MG tablet 1 tablet (total 150mg ) in the AM and 1/2 tablet (total 75mg ) in the PM      . OMEPRAZOLE PO Take by mouth. 5ml twice daily      . Water For Irrigation, Sterile (FREE WATER) SOLN Place 120 mLs into feeding tube every 8 (eight) hours.  120 mL  0   No current facility-administered medications for this visit.    Allergies  Allergen Reactions  . Iohexol Hives      Review of  Systems:   General:  normal appetite, decreased energy, no weight gain, no weight loss, no fever  Cardiac:  no chest pain with exertion, no chest pain at rest, + SOB with moderate exertion, no resting SOB, no PND, no orthopnea, no palpitations, no arrhythmia, no atrial fibrillation, no LE edema, no dizzy spells, no syncope  Respiratory:  + exertional shortness of breath, no home oxygen, no productive cough, + dry cough, no bronchitis, no wheezing, no hemoptysis, no asthma, no pain with inspiration or cough, no sleep apnea, no CPAP at night  GI:   + severe difficulty swallowing, + reflux, no frequent heartburn, no hiatal hernia, no abdominal pain, no constipation, no diarrhea, no hematochezia, no hematemesis, no melena, chronic gastrostomy tube in place  GU:  no dysuria,  no frequency, no urinary tract infection, no hematuria, + enlarged prostate, no kidney stones, no kidney disease  Vascular:  no pain suggestive of claudication, no pain in feet, no leg cramps, no varicose veins, no DVT, no non-healing foot ulcer  Neuro:   no stroke, no TIA's, no seizures, no headaches, no temporary blindness one eye,  no slurred speech, no peripheral neuropathy, no chronic pain, no instability of gait, very mild memory/cognitive dysfunction  Musculoskeletal: no arthritis, no joint swelling, no myalgias, minimal difficulty walking, normal mobility   Skin:   no rash, no itching, no skin infections, no pressure sores or ulcerations  Psych:   no anxiety, no depression, + nervousness, no unusual recent stress  Eyes:   no blurry vision, no floaters, no recent vision changes, + wears glasses or contacts  ENT:   + hearing loss, no loose or painful teeth, no dentures, last saw dentist 4 months ago  Hematologic:  + easy bruising, no abnormal bleeding, no clotting disorder, no frequent epistaxis  Endocrine:  no diabetes, does not check CBG's at home     Physical Exam:   BP 142/68  Pulse 64  Resp 16  Ht 5\' 8"  (1.727 m)   Wt 147 lb (66.679 kg)  BMI 22.36 kg/m2  SpO2 97%  General:   mildly frail but o/w well-appearing  HEENT:  Unremarkable   Neck:   no JVD, no bruits, no adenopathy   Chest:   clear to auscultation, symmetrical breath sounds, no wheezes, no rhonchi   CV:   RRR, grade III/VI diastolic murmur best at apex  Abdomen:  soft, non-tender, no masses   Extremities:  warm, well-perfused, pulses bounding throughout, no LE edema  Rectal/GU  Deferred  Neuro:   Grossly non-focal and symmetrical throughout  Skin:   Clean and dry, no rashes, no breakdown   Diagnostic Tests:  Transthoracic Echocardiography  Patient: Derelle, Cockrell MR #: [id number] Study Date: 04/16/2013 Gender: M Age: 34 Height: 175.3cm Weight: 67.6kg BSA: 1.41m^2 Pt. Status: Room:  ATTENDING Audria Nine, Dalton Darin Engels, Dalton SONOGRAPHER Aida Raider, RDCS PERFORMING [facility], [facility] cc:  ------------------------------------------------------------ LV EF: 55% - 60%  ------------------------------------------------------------ Indications: Aortic insufficiency 424.1.  ------------------------------------------------------------ History: PMH: Acquired from the patient and from the patient's chart. PMH: Dyspnea. Murmur. Aortic Insufficiency. Risk factors: Former tobacco use. Hypertension. Dyslipidemia.  ------------------------------------------------------------ Study Conclusions  - Left ventricle: The cavity size was moderately dilated. Wall thickness was increased in a pattern of severe LVH. Systolic function was normal. The estimated ejection fraction was in the range of 55% to 60%. - Aortic valve: Dilated sinus of valsalva and ascending aortic root. Severe eccentric AR. - Aorta: The aorta was moderately dilated. - Mitral valve: Mild regurgitation. - Atrial septum: No defect or patent foramen ovale was identified. Transthoracic echocardiography. M-mode, complete 2D, spectral  Doppler, and color Doppler. Height: Height: 175.3cm. Height: 69in. Weight: Weight: 67.6kg. Weight: 148.7lb. Body mass index: BMI: 22kg/m^2. Body surface area: BSA: 1.29m^2. Blood pressure: 126/62. Patient status: Outpatient. Location: [facility]  ------------------------------------------------------------  ------------------------------------------------------------ Left ventricle: The cavity size was moderately dilated. Wall thickness was increased in a pattern of severe LVH. Systolic function was normal. The estimated ejection fraction was in the range of 55% to 60%.  ------------------------------------------------------------ Aortic valve: Dilated sinus of valsalva and ascending aortic root. Severe eccentric AR.  ------------------------------------------------------------ Aorta: The aorta was moderately dilated.  ------------------------------------------------------------ Mitral valve: Doppler: Mild regurgitation.  ------------------------------------------------------------ Left atrium: The  atrium was normal in size.  ------------------------------------------------------------ Atrial septum: No defect or patent foramen ovale was identified.  ------------------------------------------------------------ Right ventricle: The cavity size was normal. Wall thickness was normal. Systolic function was normal.  ------------------------------------------------------------ Pulmonic valve: Doppler: Mild regurgitation.  ------------------------------------------------------------ Tricuspid valve: Doppler: Mild regurgitation.  ------------------------------------------------------------ Right atrium: The atrium was normal in size.  ------------------------------------------------------------ Pericardium: The pericardium was normal in appearance.  ------------------------------------------------------------ Post procedure conclusions Ascending Aorta:  - The aorta  was moderately dilated.  ------------------------------------------------------------  2D measurements Normal Doppler measurements Normal Left ventricle Main pulmonary LVID ED, 47.1 mm 43-52 artery chord, Pressure, 27 mm Hg =30 PLAX S LVID ES, 34 mm 23-38 Left ventricle chord, Ea, lat 6.36 cm/s ------ PLAX ann, tiss FS, chord, 28 % >29 DP PLAX E/Ea, lat 8.1 ------ LVPW, ED 11.5 mm ------ ann, tiss IVS/LVPW 1.71 <1.3 DP ratio, ED Ea, med 5.37 cm/s ------ Ventricular septum ann, tiss IVS, ED 19.7 mm ------ DP LVOT E/Ea, med 9.59 ------ Diam, S 20 mm ------ ann, tiss Area 3.14 cm^2 ------ DP Diam 20 mm ------ LVOT Aorta Peak vel, 163 cm/s ------ Root diam, 45 mm ------ S ED VTI, S 39 cm ------ AAo AP 45 mm ------ Peak 11 mm Hg ------ diam, S gradient, Left atrium S AP dim 33 mm ------ Stroke vol 122.5 ml ------ AP dim 1.81 cm/m^2 <2.2 Stroke 67.3 ml/m^ ------ index index 2 Aortic valve Regurg PHT 594 ms ------ Mitral valve Peak E vel 51.5 cm/s ------ Peak A vel 105 cm/s ------ Decelerati 257 ms 150-23 on time 0 Peak E/A 0.5 ------ ratio Tricuspid valve Regurg 235 cm/s ------ peak vel Peak RV-RA 22 mm Hg ------ gradient, S Systemic veins Estimated 5 mm Hg ------ CVP Right ventricle Pressure, 27 mm Hg <30 S Sa vel, 11.7 cm/s ------ lat ann, tiss DP  ------------------------------------------------------------ Prepared and Electronically Authenticated by  Charlton Haws 2014-05-07T12:55:29.823    Impression:  The patient has severe aortic insufficiency with moderate left ventricular chamber enlargement, mild left ventricular systolic dysfunction, and gradual progression of symptoms of exertional shortness of breath. The patient also has aneurysmal enlargement of the aortic root and ascending thoracic aorta. The ascending thoracic aorta has apparently not been imaged formally for several years.  The patient also has history of abdominal aortic aneurysm which  was repaired surgically in 2004, and to close family members died suddenly due to complications related to an aneurysm in the chest, one following attempted surgery and one without an attempt at surgical intervention.  The patient may be at significant risk for the development of acute aortic dissection.  Even in the absence of complications related to his ascending thoracic aorta I agree that his aortic insufficiency is clearly severe and is now associated with worsening symptoms of congestive heart failure as well as signs of left ventricular chamber enlargement on followup echocardiogram. If the patient is ever going to consider surgical intervention it would be wise to proceed before he develops any further complications. On the other hand, risks associated with surgery will unquestionably be high, and even the in the absence of significant complications the patient's recovery following surgery could be slow and somewhat prolonged.  However, at this point the patient seems to be getting along reasonably well despite his other medical problems.    Plan:  I discussed matters at length with the patient and his wife here in the office today.   He remains very reluctant to consider surgical intervention at this time.  I have emphasized my opinion that the patient's only reasonable chance to do well with surgery would be to proceed before he develops any further significant complications.  If he chooses not to proceed with surgery in the near future I would not consider him a candidate for surgical intervention in the future if he were to develop an acute aortic dissection and/or acutely decompensated congestive heart failure.  We plan to proceed with CT angiogram of the chest to formally evaluate the size of the patient's ascending thoracic aortic aneurysm. We will also check pulmonary function tests and plan to see the patient back in followup in 2 weeks. During that period time the patient and his wife will  discuss matters further to contemplate whether or not to consider high-risk surgery. He would require left and right heart catheterization to screen for the presence of significant coronary artery disease if he desires to pursue matters any further. All of their questions been addressed.    Salvatore Decent. Cornelius Moras, MD 04/25/2013 2:08 PM

## 2013-04-28 ENCOUNTER — Encounter: Payer: Medicare Other | Admitting: Thoracic Surgery (Cardiothoracic Vascular Surgery)

## 2013-05-09 ENCOUNTER — Other Ambulatory Visit: Payer: Medicare Other

## 2013-05-09 ENCOUNTER — Encounter (HOSPITAL_COMMUNITY): Payer: Medicare Other

## 2013-05-12 ENCOUNTER — Encounter: Payer: Self-pay | Admitting: Thoracic Surgery (Cardiothoracic Vascular Surgery)

## 2013-05-12 ENCOUNTER — Other Ambulatory Visit: Payer: Medicare Other

## 2013-05-12 ENCOUNTER — Ambulatory Visit (INDEPENDENT_AMBULATORY_CARE_PROVIDER_SITE_OTHER): Payer: Medicare Other | Admitting: Thoracic Surgery (Cardiothoracic Vascular Surgery)

## 2013-05-12 VITALS — BP 155/66 | HR 61 | Resp 16 | Ht 68.0 in | Wt 147.0 lb

## 2013-05-12 DIAGNOSIS — I359 Nonrheumatic aortic valve disorder, unspecified: Secondary | ICD-10-CM

## 2013-05-12 DIAGNOSIS — I350 Nonrheumatic aortic (valve) stenosis: Secondary | ICD-10-CM

## 2013-05-12 DIAGNOSIS — I712 Thoracic aortic aneurysm, without rupture: Secondary | ICD-10-CM

## 2013-05-12 DIAGNOSIS — I351 Nonrheumatic aortic (valve) insufficiency: Secondary | ICD-10-CM

## 2013-05-12 NOTE — Patient Instructions (Signed)
Keep track of your blood pressure and contact Dr Felipa Eth and/or Dr [name] to adjust blood pressure medications as needed

## 2013-05-12 NOTE — Progress Notes (Signed)
[address]       [place] [address]             [phone]     CARDIOTHORACIC SURGERY OFFICE NOTE  Referring Provider is Laurey Morale, MD PCP is Hoyle Sauer, MD   HPI:  Patient returns for followup of severe symptomatic aortic insufficiency with aneurysm of the aortic root and ascending thoracic aorta. He was originally seen in consultation on 04/25/2013. Since then the patient and his wife have made a definitive decision that they do not wish for his aortic valve disease or aneurysm to be treated surgically. The patient has numerous other comorbid medical problems and he does not wish to be treated aggressively. They canceled plans for CT angiogram to evaluate the size of this ascending thoracic aortic aneurysm. The return to the office today to discuss matters further.   Current Outpatient Prescriptions  Medication Sig Dispense Refill  . carvedilol (COREG) 6.25 MG tablet Take 1 tablet (6.25 mg total) by mouth 2 (two) times daily.  180 tablet  3  . Feeding Tubes MISC by Does not apply route. Per gravity...HHN...CANS ARE GIVEN TO MAINTAIN WEIGHT      . irbesartan (AVAPRO) 150 MG tablet 1 tablet (total 150mg ) in the AM and 1/2 tablet (total 75mg ) in the PM      . OMEPRAZOLE PO Take by mouth. 5ml twice daily      . Water For Irrigation, Sterile (FREE WATER) SOLN Place 120 mLs into feeding tube every 8 (eight) hours.  120 mL  0   No current facility-administered medications for this visit.      Physical Exam:   BP 155/66  Pulse 61  Resp 16  Ht 5\' 8"  (1.727 m)  Wt 147 lb (66.679 kg)  BMI 22.36 kg/m2  SpO2 97%  General:  Frail but well-appearing  Chest:   Clear to auscultation  CV:   Regular rate and rhythm with diastolic murmur  Incisions:  n/a  Abdomen:  Soft and nontender  Extremities:  Warm and well-perfused  Diagnostic Tests:  n/a   Impression:  The patient has severe aortic insufficiency with moderate left ventricular chamber  enlargement, mild left ventricular systolic dysfunction, and gradual progression of symptoms of exertional shortness of breath. The patient also has aneurysmal enlargement of the aortic root and ascending thoracic aorta.  The patient and his wife have made a decision that he does not wish to be treated aggressively including no surgical intervention for treatment of aortic insufficiency lower his aneurysm involving the aortic root and ascending thoracic aorta. His blood pressure is still running somewhat on the high side.    Plan:  I had a lengthy discussion with the patient and his wife in the office today. We discussed the definitive implications of his decision. He understands that although he remains clinically stable at present he will remain at risk for the development of worsening symptoms or complications related to congestive heart failure and/or the potential for development of acute type A aortic dissection. In either case he wishes to be treated palliatively. We discussed the implications for CODE STATUS which she will discuss further with Dr. Felipa Eth and Dr. [name]. I did mention the fact that under the circumstances it would be reasonable to continue to treat his hypertension and symptoms of congestive heart failure medically, and close attention to these problems might prolong his life with medical therapy. All of their questions been addressed. In the future  they will call and return as needed.   Salvatore Decent. Cornelius Moras, MD 05/12/2013 10:37 AM

## 2013-05-14 ENCOUNTER — Telehealth: Payer: Self-pay | Admitting: Cardiology

## 2013-05-14 MED ORDER — CARVEDILOL 12.5 MG PO TABS: 12.5000 mg | ORAL_TABLET | Freq: Two times a day (BID) | ORAL | Status: AC

## 2013-05-14 NOTE — Telephone Encounter (Signed)
Pt's wife states pt has decided against surgical treatment for his aortic valve at this time.

## 2013-05-14 NOTE — Telephone Encounter (Signed)
Spoke with patient's wife. She states patient's BP average 135-140/50s. Rare SBP in the 160s, otherwise SBP 130s/140s.

## 2013-05-14 NOTE — Telephone Encounter (Signed)
Pt's wife advised, verbalized understanding.

## 2013-05-14 NOTE — Telephone Encounter (Signed)
New problem    Need to discuss pt b/p

## 2013-05-14 NOTE — Telephone Encounter (Signed)
BP a little high, he can increase Coreg to 12.5 mg bid. I heard from Dr. Cornelius Moras about declining operation.

## 2013-05-29 ENCOUNTER — Ambulatory Visit (HOSPITAL_COMMUNITY)
Admission: RE | Admit: 2013-05-29 | Discharge: 2013-05-29 | Disposition: A | Discharge status: Home / Self Care | Payer: Medicare Other | Source: Ambulatory Visit | Attending: Interventional Radiology | Admitting: Interventional Radiology

## 2013-05-29 ENCOUNTER — Other Ambulatory Visit (HOSPITAL_COMMUNITY): Payer: Self-pay | Admitting: Interventional Radiology

## 2013-05-29 DIAGNOSIS — K9423 Gastrostomy malfunction: Secondary | ICD-10-CM | POA: Insufficient documentation

## 2013-05-29 DIAGNOSIS — Y833 Surgical operation with formation of external stoma as the cause of abnormal reaction of the patient, or of later complication, without mention of misadventure at the time of the procedure: Secondary | ICD-10-CM | POA: Insufficient documentation

## 2013-05-29 NOTE — Procedures (Signed)
Successful replacement of 24Fr balloon retention gastrotomy tube. No complications  Brayton El PA-C Interventional Radiology 05/29/2013 10:02 AM

## 2013-06-19 ENCOUNTER — Telehealth: Payer: Self-pay | Admitting: Nurse Practitioner

## 2013-06-19 DIAGNOSIS — I959 Hypotension, unspecified: Secondary | ICD-10-CM

## 2013-06-19 NOTE — Telephone Encounter (Signed)
BP high with severe AI.  Increase Avapro to 150 mg bid with BMET in 2 wks.

## 2013-06-19 NOTE — Telephone Encounter (Signed)
I called patient's wife to report her lab results and she informed me that Katina Dung, RN and Dr. [name] had asked her to report her husband's (the patient's) BP since increasing his BP medication.  Patient's wife reports systolic BP consistently 140's-150's and diastolic BP consistently 50's-60's. I advised patient's wife that Dr. [name] and Thurston Hole are out of the office until next week that I will send them this message and that our office will be in touch if Dr. [name] wants to make any changes.  Patient's wife verbalized understanding and gratitude.

## 2013-06-20 NOTE — Telephone Encounter (Addendum)
**Note De-Identified Tequlia Gonsalves Obfuscation** Pts wife, Eber Jones, is advised, she verbalized understanding. Avapro dose updated in pts chart and BMET ordered and scheduled to be drawn on 7/25, Eber Jones aware and agrees with plan.

## 2013-06-20 NOTE — Addendum Note (Signed)
**Note De-Identified Ivy Meriwether Obfuscation** Addended by: Demetrios Loll on: 06/20/2013 08:58 AM   Modules accepted: Orders

## 2013-07-04 ENCOUNTER — Other Ambulatory Visit (INDEPENDENT_AMBULATORY_CARE_PROVIDER_SITE_OTHER): Payer: Medicare Other

## 2013-07-04 ENCOUNTER — Telehealth: Payer: Self-pay | Admitting: Cardiology

## 2013-07-04 ENCOUNTER — Telehealth: Payer: Self-pay | Admitting: *Deleted

## 2013-07-04 DIAGNOSIS — I959 Hypotension, unspecified: Secondary | ICD-10-CM

## 2013-07-04 LAB — BASIC METABOLIC PANEL
Calcium: 9.1 mg/dL (ref 8.4–10.5)
Creatinine, Ser: 0.9 mg/dL (ref 0.4–1.5)
GFR: 88.17 mL/min (ref >60.00)
Sodium: 142 mEq/L (ref 135–145)

## 2013-07-04 NOTE — Telephone Encounter (Signed)
Pod D

## 2013-07-04 NOTE — Telephone Encounter (Signed)
Pt's wife notified.

## 2013-07-04 NOTE — Telephone Encounter (Signed)
Follow up ° ° ° ° °Pt wife returning your call °

## 2013-07-04 NOTE — Telephone Encounter (Signed)
Pt's wife states pt's BP better and averaging 129-141/40-50 since avapro increased to 150mg  bid. Pt had BMET today. I will forward to Dr [name] for review and recommendations.

## 2013-07-04 NOTE — Telephone Encounter (Signed)
OK no changes.  

## 2013-07-04 NOTE — Telephone Encounter (Signed)
Message left for me that pt's wife would like to talk with me about his BP. LMTCB for pt's wife.

## 2013-07-04 NOTE — Telephone Encounter (Signed)
Spoke with patient's wife.

## 2013-07-07 ENCOUNTER — Other Ambulatory Visit: Payer: Self-pay | Admitting: *Deleted

## 2013-07-07 MED ORDER — IRBESARTAN 150 MG PO TABS: 150.0000 mg | ORAL_TABLET | Freq: Two times a day (BID) | ORAL | Status: DC

## 2013-07-16 ENCOUNTER — Other Ambulatory Visit: Payer: Self-pay

## 2013-08-06 ENCOUNTER — Encounter: Payer: Self-pay | Admitting: Cardiology

## 2013-08-06 ENCOUNTER — Ambulatory Visit (INDEPENDENT_AMBULATORY_CARE_PROVIDER_SITE_OTHER): Payer: Medicare Other | Admitting: Cardiology

## 2013-08-06 VITALS — BP 162/58 | HR 68 | Ht 70.0 in | Wt 149.2 lb

## 2013-08-06 DIAGNOSIS — R0902 Hypoxemia: Secondary | ICD-10-CM

## 2013-08-06 DIAGNOSIS — K922 Gastrointestinal hemorrhage, unspecified: Secondary | ICD-10-CM

## 2013-08-06 DIAGNOSIS — I351 Nonrheumatic aortic (valve) insufficiency: Secondary | ICD-10-CM

## 2013-08-06 DIAGNOSIS — I359 Nonrheumatic aortic valve disorder, unspecified: Secondary | ICD-10-CM

## 2013-08-06 MED ORDER — POTASSIUM CHLORIDE ER 10 MEQ PO TBCR: 10.0000 meq | EXTENDED_RELEASE_TABLET | Freq: Every day | ORAL | Status: AC

## 2013-08-06 MED ORDER — FUROSEMIDE 40 MG PO TABS: 40.0000 mg | ORAL_TABLET | Freq: Every day | ORAL | Status: AC

## 2013-08-06 MED ORDER — AMLODIPINE BESYLATE 5 MG PO TABS: 5.0000 mg | ORAL_TABLET | Freq: Every day | ORAL | Status: DC

## 2013-08-06 NOTE — Patient Instructions (Addendum)
Start amlodipine 5mg  daily.   Start lasix (furosemide) 40mg  daily.  Start KCL(potassium) 10 mEq daily.   A chest x-ray takes a picture of the organs and structures inside the chest, including the heart, lungs, and blood vessels. This test can show several things, including, whether the heart is enlarges; whether fluid is building up in the lungs; and whether pacemaker / defibrillator leads are still in place. Today at the [facility] office at [address]. You do not need an appointment.  .You have been referred to Hospice. Evette will be in touch with you tomorrow.   Your physician recommends that you return for lab work in: 2 weeks--BMET/BNP.  Your physician recommends that you schedule a follow-up appointment in: 1 month with Dr [name].

## 2013-08-07 ENCOUNTER — Ambulatory Visit (INDEPENDENT_AMBULATORY_CARE_PROVIDER_SITE_OTHER)
Admission: RE | Admit: 2013-08-07 | Discharge: 2013-08-07 | Disposition: A | Discharge status: Home / Self Care | Payer: Medicare Other | Source: Ambulatory Visit | Attending: Cardiology | Admitting: Cardiology

## 2013-08-07 ENCOUNTER — Ambulatory Visit: Payer: Medicare Other | Admitting: Cardiology

## 2013-08-07 DIAGNOSIS — I359 Nonrheumatic aortic valve disorder, unspecified: Secondary | ICD-10-CM

## 2013-08-07 DIAGNOSIS — R0902 Hypoxemia: Secondary | ICD-10-CM | POA: Insufficient documentation

## 2013-08-07 NOTE — Progress Notes (Signed)
Patient ID: Corey [name], male   DOB: 1929-05-13, 79 y.o.   MRN: [id number] PCP: Dr. Felipa Eth  79 yo with history of achalasia and PEG tube, severe aortic insufficiency, and AAA repair complicated by post-op encephalopathy presents for cardiology followup. Last GI complication was a GI bleed from a gastric ulcer in the setting of NSAID use in 10/13.  This required transfusion.  Last echo in 5/14 showed moderate LV dilation with EF 55-60%.  There was severe AI and aortic root dilated to 4.5 cm.    Since I last saw Corey [name], he has seen Dr. Cornelius Moras twice to talk about aortic valve replacement.  Given his debilitated state and the trouble that he had with his last surgery (post-op encephalopathy with prolonged hospitalization), he decided against cardiac surgery.  He and his wife understand the poor prognosis with severe, symptomatic aortic insufficiency.  He has had a functional decline since I last saw him.  He is short of breath walking down the hall at his house.  His wife brought him up by wheelchair today. Oxygen saturation is 85% at rest.  Dyspnea has been gradually worse but especially bad over the last 2 wks.  His blood pressure has been running high, in the 160s-170s systolic.  He also has developed severe low back pain and is having a hard time walking.  Weight is stable.   Labs (7/12): LDL 83, HDL 76 Labs (1/13): K 3.1, creatinine 0.71, HCT 24.4 Labs (10/13): K 3.8, creatinine 0.8  PMH: 1. Mild memory deficit 2. Achalasia: Severe, has PEG tube.  3. AAA repair in 2004.  Complicated by post-op encephalopathy and prolonged hospitalization.  4. Aortic insufficiency: Echo (3/12) with EF 55-60%, grade II diastolic dysfunction, moderate AI, PA systolic pressure 36 mmHg, dilated aortic root.  Echo (1/13) with EF 55-60%, moderate LV dilation, severe aortic insufficiency with a trileaflet aortic valve, no AS, mild Corey with SAM in the setting of focal basal septal hypertrophy, aortic root dilated to 4.4 cm.  Echo  (5/14): EF 55-60%, moderate LV dilation, severe AI, no AS, aortic root dilated to 4.5 cm.  5. CAD: LHC 2006 with 40-50% proximal LAD.  6. Hyperlipidemia 7. HTN 8. H/o TURP 9. PUD  SH: Lives in [place] with wife, has 4 children. Quit smoking in 1984.    FH: No premature CAD.   ROS: All systems reviewed and negative except as per HPI.   Current Outpatient Prescriptions  Medication Sig Dispense Refill  . acetaminophen (TYLENOL) 500 MG tablet Take 500 mg by mouth every 6 (six) hours as needed for pain.      . carvedilol (COREG) 12.5 MG tablet Take 1 tablet (12.5 mg total) by mouth 2 (two) times daily.  180 tablet  3  . Feeding Tubes MISC by Does not apply route. Per gravity...HHN...CANS ARE GIVEN TO MAINTAIN WEIGHT      . irbesartan (AVAPRO) 150 MG tablet Take 1 tablet (150 mg total) by mouth 2 (two) times daily.  180 tablet  3  . morphine 10 MG/5ML solution Take 2.5 mg by mouth every 4 (four) hours as needed for pain.      Marland Kitchen OMEPRAZOLE PO Take by mouth. 5ml twice daily      . Water For Irrigation, Sterile (FREE WATER) SOLN Place 120 mLs into feeding tube every 8 (eight) hours.  120 mL  0  . amLODipine (NORVASC) 5 MG tablet Take 1 tablet (5 mg total) by mouth daily.  30 tablet  6  .  furosemide (LASIX) 40 MG tablet Take 1 tablet (40 mg total) by mouth daily.  30 tablet  6  . potassium chloride (KLOR-CON 10) 10 MEQ tablet Take 1 tablet (10 mEq total) by mouth daily.  30 tablet  6   No current facility-administered medications for this visit.    BP 162/58  Pulse 68  Ht 5\' 10"  (1.778 m)  Wt 67.677 kg (149 lb 3.2 oz)  BMI 21.41 kg/m2  SpO2 85% General: NAD Neck: JVP 8-9 cm with HJR, no thyromegaly or thyroid nodule.  Lungs: Slight crackles at bases bilaterally. CV: Nondisplaced PMI.  Heart regular S1/S2, no S3/S4, 2/6 SEM RUSB, 3/6 diastolic murmur along the sternal border.  No peripheral edema.  No carotid bruit.  Normal pedal pulses.  Abdomen: Soft, nontender, no  hepatosplenomegaly, no distention.  Neurologic: Alert and oriented x 3.  Psych: Normal affect. Extremities: No clubbing or cyanosis.   Assessment/Plan:  Aortic insufficiency  Severe AI with dilated LV.  He has become symptomatic over the last 6 months or so, progressing to current NYHA class III-IV symptoms.  He and his wife decided against surgery given his comorbidities and the difficult time he had with AAA surgery in 2004.  At this point, I will try to help him feel better but there is nothing I can do medically to appreciably alter the natural history of severe, symptomatic AI.   - Control BP to promote forward flow across aortic valve.  BP is still running high.  He will continue Coreg and irbesartan.  I will start him on amlodipine 5 mg daily.  - He has some volume overload on exam with decreased oxygen saturation.  I will add Lasix 40 mg daily with KCl 10 mEq daily.  BMET/BNP in 1 week.  GI No further overt GI bleeding.  He is not having trouble currently with his PEG tube.  He is off aspirin due to history of severe upper GI bleeding.  He sees Dr. Madilyn Fireman.  Hypoxemia Oxygen saturation 85% at rest.  I am going to arrange for home oxygen.  I suspect this is primarily due to pulmonary edema but will get CXR to rule out PNA.   I talked to Corey [name] and his wife at length about home hospice.  I think this would be useful and they agree.  I will arrange.   Marca Ancona 08/07/2013

## 2013-08-19 ENCOUNTER — Telehealth: Payer: Self-pay | Admitting: Cardiology

## 2013-08-19 NOTE — Telephone Encounter (Signed)
Would cut out amlodipine for now.  Keep an eye on BPs over the next few days.

## 2013-08-19 NOTE — Telephone Encounter (Signed)
Pt's wife notified.

## 2013-08-19 NOTE — Telephone Encounter (Signed)
New problem    [facility]  calling    Blood pressure today @ 1:30  98/50  Sitting   @ 2 pm 96/50. Sitting. No symptoms . Pt doing well, no pain .

## 2013-08-19 NOTE — Telephone Encounter (Signed)
Spoke with patient's wife. Pt resting comfortably, no unusual lightheadedness or dizziness, no recent infections.  Pt did have enema yesterday and took morphine several times yesterday for back pain. He has taken morphine a couple of times today. Wife states BP has been up and down. I will forward to Dr [name]

## 2013-08-20 ENCOUNTER — Ambulatory Visit (INDEPENDENT_AMBULATORY_CARE_PROVIDER_SITE_OTHER): Payer: Medicare Other | Admitting: *Deleted

## 2013-08-20 DIAGNOSIS — R0602 Shortness of breath: Secondary | ICD-10-CM

## 2013-08-20 DIAGNOSIS — I10 Essential (primary) hypertension: Secondary | ICD-10-CM

## 2013-08-20 DIAGNOSIS — E785 Hyperlipidemia, unspecified: Secondary | ICD-10-CM

## 2013-08-20 DIAGNOSIS — I959 Hypotension, unspecified: Secondary | ICD-10-CM

## 2013-08-20 LAB — BASIC METABOLIC PANEL
Chloride: 106 mEq/L (ref 96–112)
Creatinine, Ser: 0.7 mg/dL (ref 0.4–1.5)
GFR: 109.35 mL/min (ref >60.00)
Potassium: 3.9 mEq/L (ref 3.5–5.1)

## 2013-08-20 LAB — BRAIN NATRIURETIC PEPTIDE: Pro B Natriuretic peptide (BNP): 298 pg/mL — ABNORMAL HIGH (ref 0.0–100.0)

## 2013-09-01 ENCOUNTER — Ambulatory Visit (HOSPITAL_COMMUNITY)
Admission: RE | Admit: 2013-09-01 | Discharge: 2013-09-01 | Disposition: A | Discharge status: Home / Self Care | Payer: Medicare Other | Source: Ambulatory Visit | Attending: Interventional Radiology | Admitting: Interventional Radiology

## 2013-09-01 ENCOUNTER — Other Ambulatory Visit (HOSPITAL_COMMUNITY): Payer: Self-pay | Admitting: Interventional Radiology

## 2013-09-01 DIAGNOSIS — Z431 Encounter for attention to gastrostomy: Secondary | ICD-10-CM | POA: Insufficient documentation

## 2013-09-01 DIAGNOSIS — Z931 Gastrostomy status: Secondary | ICD-10-CM

## 2013-09-10 ENCOUNTER — Encounter: Payer: Self-pay | Admitting: Cardiology

## 2013-09-10 ENCOUNTER — Ambulatory Visit (INDEPENDENT_AMBULATORY_CARE_PROVIDER_SITE_OTHER): Payer: Medicare Other | Admitting: Cardiology

## 2013-09-10 VITALS — BP 102/51 | HR 67 | Wt 143.0 lb

## 2013-09-10 DIAGNOSIS — I351 Nonrheumatic aortic (valve) insufficiency: Secondary | ICD-10-CM

## 2013-09-10 DIAGNOSIS — K922 Gastrointestinal hemorrhage, unspecified: Secondary | ICD-10-CM

## 2013-09-10 DIAGNOSIS — I10 Essential (primary) hypertension: Secondary | ICD-10-CM

## 2013-09-10 DIAGNOSIS — I359 Nonrheumatic aortic valve disorder, unspecified: Secondary | ICD-10-CM

## 2013-09-10 NOTE — Patient Instructions (Addendum)
Your physician recommends that you schedule a follow-up appointment in: 3 months with Dr [name].   Your physician recommends that you return for lab work in: 3 months when you see Dr Frutoso Chase.

## 2013-09-11 NOTE — Progress Notes (Signed)
Patient ID: Corey [name], male   DOB: Dec 15, 1928, 79 y.o.   MRN: [id number] PCP: Dr. Felipa Eth  79 yo with history of achalasia and PEG tube, severe aortic insufficiency, and AAA repair complicated by post-op encephalopathy presents for cardiology followup. Last GI complication was a GI bleed from a gastric ulcer in the setting of NSAID use in 10/13.  This required transfusion.  Last echo in 5/14 showed moderate LV dilation with EF 55-60%.  There was severe AI and aortic root dilated to 4.5 cm.    He saw Dr. Cornelius Moras twice to talk about aortic valve replacement.  Given his debilitated state and the trouble that he had with his last surgery (post-op encephalopathy with prolonged hospitalization), he decided against cardiac surgery.  He and his wife understand the poor prognosis with severe, symptomatic aortic insufficiency.  He has had functional decline over the last few months and is now on home hospice.  He is short of breath walking down the hall at his house.  His wife brought him up by wheelchair today. He uses oxygen occasionally.  His back pain is better.   He is now off amlodipine because blood pressure has decreased.   Labs (7/12): LDL 83, HDL 76 Labs (1/13): K 3.1, creatinine 0.71, HCT 24.4 Labs (10/13): K 3.8, creatinine 0.8 Labs (9/14): K 3.9, creatinine 0.7, BNP 298  PMH: 1. Mild memory deficit 2. Achalasia: Severe, has PEG tube.  3. AAA repair in 2004.  Complicated by post-op encephalopathy and prolonged hospitalization.  4. Aortic insufficiency: Echo (3/12) with EF 55-60%, grade II diastolic dysfunction, moderate AI, PA systolic pressure 36 mmHg, dilated aortic root.  Echo (1/13) with EF 55-60%, moderate LV dilation, severe aortic insufficiency with a trileaflet aortic valve, no AS, mild MR with SAM in the setting of focal basal septal hypertrophy, aortic root dilated to 4.4 cm.  Echo (5/14): EF 55-60%, moderate LV dilation, severe AI, no AS, aortic root dilated to 4.5 cm.  5. CAD: LHC 2006 with  40-50% proximal LAD.  6. Hyperlipidemia 7. HTN 8. H/o TURP 9. PUD  SH: Lives in [place] with wife, has 4 children. Quit smoking in 1984.    FH: No premature CAD.    Current Outpatient Prescriptions  Medication Sig Dispense Refill  . acetaminophen (TYLENOL) 500 MG tablet Take 500 mg by mouth every 6 (six) hours as needed for pain.      . carvedilol (COREG) 12.5 MG tablet Take 1 tablet (12.5 mg total) by mouth 2 (two) times daily.  180 tablet  3  . diazepam (VALIUM) 1 MG/ML solution as needed.      . Feeding Tubes MISC by Does not apply route. Per gravity...HHN...CANS ARE GIVEN TO MAINTAIN WEIGHT      . furosemide (LASIX) 40 MG tablet Take 1 tablet (40 mg total) by mouth daily.  30 tablet  6  . irbesartan (AVAPRO) 150 MG tablet Take 1 tablet (150 mg total) by mouth 2 (two) times daily.  180 tablet  3  . morphine 10 MG/5ML solution Take 2.5 mg by mouth every 4 (four) hours as needed for pain.      . potassium chloride (KLOR-CON 10) 10 MEQ tablet Take 1 tablet (10 mEq total) by mouth daily.  30 tablet  6  . Water For Irrigation, Sterile (FREE WATER) SOLN Place 120 mLs into feeding tube every 8 (eight) hours.  120 mL  0   No current facility-administered medications for this visit.    BP  102/51  Pulse 67  Wt 64.864 kg (143 lb)  BMI 20.52 kg/m2 General: NAD Neck: JVP 7 cm, no thyromegaly or thyroid nodule.  Lungs: Slight crackles at bases bilaterally. CV: Nondisplaced PMI.  Heart regular S1/S2, no S3/S4, 2/6 SEM RUSB, 3/6 diastolic murmur along the sternal border.  No peripheral edema.  No carotid bruit.  Normal pedal pulses.  Abdomen: Soft, nontender, no hepatosplenomegaly, no distention.  Neurologic: Alert and oriented x 3.  Psych: Normal affect. Extremities: No clubbing or cyanosis.   Assessment/Plan:  Aortic insufficiency  Severe AI with dilated LV.  He has become symptomatic over the last 6 months or so, progressing to current NYHA class III-IV symptoms.  He and his wife  decided against surgery given his comorbidities and the difficult time he had with AAA surgery in 2004.  At this point, I will try to help him feel better but there is nothing I can do medically to appreciably alter the natural history of severe, symptomatic AI. He is getting home hospice.   - He will continue Coreg and irbesartan.    - Volume status looks ok on current Lasix and K/creatinine were ok when recently checked.  GI No further overt GI bleeding.  He is not having trouble currently with his PEG tube.  He is off aspirin due to history of severe upper GI bleeding.  He sees Dr. Madilyn Fireman.   Marca Ancona 09/11/2013

## 2013-09-30 ENCOUNTER — Telehealth: Payer: Self-pay | Admitting: Cardiology

## 2013-09-30 NOTE — Telephone Encounter (Signed)
Pt's wife states pt's BP has been in the 90/50 range, so this is about the same as it has been. Wife states pt does have occ SOB with exertion that is unchanged and rare lightheadedness but otherwise OK.

## 2013-09-30 NOTE — Telephone Encounter (Signed)
[facility]  [facility] called to let Dr [name] know that patients BP is still running 90/50, but has no other symptoms. Just wanted to doctor to be aware. Please call if any questions.

## 2013-10-16 ENCOUNTER — Other Ambulatory Visit: Payer: Self-pay

## 2013-11-04 ENCOUNTER — Other Ambulatory Visit (HOSPITAL_COMMUNITY): Payer: Self-pay | Admitting: Diagnostic Radiology

## 2013-11-04 ENCOUNTER — Ambulatory Visit (HOSPITAL_COMMUNITY)
Admission: RE | Admit: 2013-11-04 | Discharge: 2013-11-04 | Disposition: A | Discharge status: Home / Self Care | Payer: Medicare Other | Source: Ambulatory Visit | Attending: Diagnostic Radiology | Admitting: Diagnostic Radiology

## 2013-11-04 DIAGNOSIS — R633 Feeding difficulties: Secondary | ICD-10-CM

## 2013-11-18 ENCOUNTER — Other Ambulatory Visit: Payer: Self-pay

## 2013-11-18 MED ORDER — IRBESARTAN 150 MG PO TABS: 150.0000 mg | ORAL_TABLET | Freq: Two times a day (BID) | ORAL | Status: DC

## 2013-11-27 ENCOUNTER — Telehealth: Payer: Self-pay | Admitting: *Deleted

## 2013-11-27 MED ORDER — IRBESARTAN 300 MG PO TABS: 150.0000 mg | ORAL_TABLET | Freq: Two times a day (BID) | ORAL | Status: DC

## 2013-11-27 NOTE — Telephone Encounter (Signed)
Formulary change for 2015, patient was on irbesartan 150 mg  bid, change to 300 mg tablet take 1/2 tablet bid

## 2013-12-08 ENCOUNTER — Other Ambulatory Visit: Payer: Medicare Other

## 2013-12-08 ENCOUNTER — Encounter: Payer: Self-pay | Admitting: Cardiology

## 2013-12-08 ENCOUNTER — Ambulatory Visit (INDEPENDENT_AMBULATORY_CARE_PROVIDER_SITE_OTHER): Payer: Medicare Other | Admitting: Cardiology

## 2013-12-08 VITALS — BP 100/58 | HR 58 | Ht 70.0 in | Wt 143.0 lb

## 2013-12-08 DIAGNOSIS — I351 Nonrheumatic aortic (valve) insufficiency: Secondary | ICD-10-CM

## 2013-12-08 DIAGNOSIS — I10 Essential (primary) hypertension: Secondary | ICD-10-CM

## 2013-12-08 DIAGNOSIS — I359 Nonrheumatic aortic valve disorder, unspecified: Secondary | ICD-10-CM

## 2013-12-08 DIAGNOSIS — K922 Gastrointestinal hemorrhage, unspecified: Secondary | ICD-10-CM

## 2013-12-08 LAB — BASIC METABOLIC PANEL
CO2: 29 mEq/L (ref 19–32)
Calcium: 9 mg/dL (ref 8.4–10.5)
Chloride: 111 mEq/L (ref 96–112)
Creatinine, Ser: 0.9 mg/dL (ref 0.4–1.5)
Sodium: 148 mEq/L — ABNORMAL HIGH (ref 135–145)

## 2013-12-08 MED ORDER — IRBESARTAN 300 MG PO TABS: 150.0000 mg | ORAL_TABLET | Freq: Every day | ORAL | Status: DC

## 2013-12-08 NOTE — Progress Notes (Signed)
Patient ID: Corey [name], male   DOB: Sep 13, 1929, 79 y.o.   MRN: [id number] PCP: Dr. Felipa Eth  79 yo with history of achalasia and PEG tube, severe aortic insufficiency, and AAA repair complicated by post-op encephalopathy presents for cardiology followup. Last GI complication was a GI bleed from a gastric ulcer in the setting of NSAID use in 10/13.  This required transfusion.  Last echo in 5/14 showed moderate LV dilation with EF 55-60%.  There was severe AI and aortic root was dilated to 4.5 cm.    He saw Dr. Cornelius Moras twice to talk about aortic valve replacement.  Given his debilitated state and the trouble that he had with his last surgery (post-op encephalopathy with prolonged hospitalization), he decided against cardiac surgery.  He and his wife understand the poor prognosis with severe, symptomatic aortic insufficiency.  He has had functional decline over the last year and is now on home hospice.  He is short of breath walking down the hall at his house, this is stable.  He uses oxygen occasionally.  His back pain is not as prominent.   Very little energy.  He rarely has lightheadedness with standing, but BP is on the low side today.  Per his home nurse, SBP runs frequently < 100.  Weight is stable.   ECG: NSR, PACs, LVH with repolarization abnormality  Labs (7/12): LDL 83, HDL 76 Labs (1/13): K 3.1, creatinine 0.71, HCT 24.4 Labs (10/13): K 3.8, creatinine 0.8 Labs (9/14): K 3.9, creatinine 0.7, BNP 298  PMH: 1. Mild memory deficit 2. Achalasia: Severe, has PEG tube.  3. AAA repair in 2004.  Complicated by post-op encephalopathy and prolonged hospitalization.  4. Aortic insufficiency: Echo (3/12) with EF 55-60%, grade II diastolic dysfunction, moderate AI, PA systolic pressure 36 mmHg, dilated aortic root.  Echo (1/13) with EF 55-60%, moderate LV dilation, severe aortic insufficiency with a trileaflet aortic valve, no AS, mild MR with SAM in the setting of focal basal septal hypertrophy, aortic root  dilated to 4.4 cm.  Echo (5/14): EF 55-60%, moderate LV dilation, severe AI, no AS, aortic root dilated to 4.5 cm.  5. CAD: LHC 2006 with 40-50% proximal LAD.  6. Hyperlipidemia 7. HTN 8. H/o TURP 9. PUD  SH: Lives in [place] with wife, has 4 children. Quit smoking in 1984.    FH: No premature CAD.   ROS: All systems reviewed and negative except as per HPI.    Current Outpatient Prescriptions  Medication Sig Dispense Refill  . acetaminophen (TYLENOL) 500 MG tablet Take 500 mg by mouth every 6 (six) hours as needed for pain.      . calcitonin, salmon, (MIACALCIN/FORTICAL) 200 UNIT/ACT nasal spray as directed.       . carvedilol (COREG) 12.5 MG tablet Take 1 tablet (12.5 mg total) by mouth 2 (two) times daily.  180 tablet  3  . Feeding Tubes MISC by Does not apply route. Per gravity...HHN...CANS ARE GIVEN TO MAINTAIN WEIGHT      . furosemide (LASIX) 40 MG tablet Take 1 tablet (40 mg total) by mouth daily.  30 tablet  6  . irbesartan (AVAPRO) 300 MG tablet Take 0.5 tablets (150 mg total) by mouth daily.      Marland Kitchen morphine 10 MG/5ML solution Take 2.5 mg by mouth every 4 (four) hours as needed for pain.      . potassium chloride (KLOR-CON 10) 10 MEQ tablet Take 1 tablet (10 mEq total) by mouth daily.  30 tablet  6  . Water For Irrigation, Sterile (FREE WATER) SOLN Place 120 mLs into feeding tube every 8 (eight) hours.  120 mL  0  . diazepam (VALIUM) 1 MG/ML solution as needed.       No current facility-administered medications for this visit.    BP 100/58  Pulse 58  Ht 5\' 10"  (1.778 m)  Wt 64.864 kg (143 lb)  BMI 20.52 kg/m2 General: NAD Neck: JVP 7 cm, no thyromegaly or thyroid nodule.  Lungs: Slight crackles at bases bilaterally. CV: Nondisplaced PMI.  Heart regular S1/S2, no S3/S4, 2/6 SEM RUSB, 3/6 diastolic murmur along the sternal border.  No peripheral edema.  No carotid bruit.  Normal pedal pulses.  Abdomen: Soft, nontender, no hepatosplenomegaly, no distention.   Neurologic: Alert and oriented x 3.  Psych: Normal affect. Extremities: No clubbing or cyanosis.   Assessment/Plan:  Aortic insufficiency  Severe AI with dilated LV.  He has become symptomatic over the last year, progressing to current NYHA class III-IV symptoms.  He and his wife decided against surgery given his comorbidities and the difficult time he had with AAA surgery in 2004.  At this point, I will try to help him feel better but there is nothing I can do medically to appreciably alter the natural history of severe, symptomatic AI. He is getting home hospice.   - He will continue Coreg.  - I will decrease irbesartan to 150 mg daily given soft blood pressure.     - Volume status looks ok on current Lasix.  I will check BMET today.  GI No further overt GI bleeding.  He is not having trouble currently with his PEG tube.  He is off aspirin due to history of severe upper GI bleeding.  He sees Dr. Madilyn Fireman.   Marca Ancona 12/08/2013

## 2013-12-08 NOTE — Patient Instructions (Signed)
Decrease Irbesartan to 150mg  daliy  Lab today: bmet  Your physician wants you to follow-up in: 4 months You will receive a reminder letter in the mail two months in advance. If you don't receive a letter, please call our office to schedule the follow-up appointment.

## 2014-01-21 ENCOUNTER — Telehealth: Payer: Self-pay | Admitting: Cardiology

## 2014-01-21 NOTE — Telephone Encounter (Signed)
Amy from Hospice called regarding needing order signed for patient to take Irbesartan. Stated they have faxed the order several times but will fax again. Fax has not come through yet. Will be on the lookout for it. Routed message to Dr. Gae GallopMcLean/Anne Lankford, RN.

## 2014-01-21 NOTE — Telephone Encounter (Signed)
If someone will bring it to me I will sign it.

## 2014-01-21 NOTE — Telephone Encounter (Signed)
Routed to Margorie JohnAnn Lankford, Charity fundraiserN, to follow up. Still awaiting fax.

## 2014-01-21 NOTE — Telephone Encounter (Signed)
New message    Need order signed for pt to take irbesartan.n  They have faxed the order several times but will fax it again.

## 2014-01-22 NOTE — Telephone Encounter (Signed)
Mylo Redebbie Gray RN found order and gave to Dr.McLean to sign.

## 2014-01-26 NOTE — Telephone Encounter (Signed)
Dr Shirlee LatchMcLean signed this order and is was given to HIM to fax today.

## 2014-02-06 ENCOUNTER — Telehealth: Payer: Self-pay | Admitting: Cardiology

## 2014-02-06 NOTE — Telephone Encounter (Signed)
Cut dose in half, hold for SBP < 110.

## 2014-02-06 NOTE — Telephone Encounter (Signed)
lmtcb

## 2014-02-06 NOTE — Telephone Encounter (Signed)
New message   Home health going to home today.    Couple of things to report    1. His blood pressure on wed 82 /42 . Hospice  physician order to  hold irbesartan 300 mg  . His blood pressure has been below normal for month.     2. Need some guideline on medication.    3. Have some concerns regarding patient status . He develop thrush , weaker , more confuse. He appears dehydrate. Wife wants comfort med's only . The office tried calling several time this am. Hospice  physician order diazepam - liquid 5 mg solution via peg tub every 8 hours . Prn restless or agation  . They do think his kidney function is decline.

## 2014-02-06 NOTE — Telephone Encounter (Signed)
irbesartan was held Thursday and today per hospice MD-- BP 112-54, HR 65 today.  Diflucan not effective for thrush.  It is worse, with open sores, stringy mucous, very painful.  Hospice MD prescribed amox soln 250mg  Q8 hr via peg x 1 week and viscous lidocaine soln 1 tsp Q4 hr prn  Using roxanol more frequently due to mouth pain.  Added dulcolax supp in anticipation of constipation.  Had diazepam in the home that they've used in the past as muscle relaxant. He is intermitantly aggitated and so MD also prescribed the diazepam for this.  Do you want to DC the irbesartan or give parameter to hold?

## 2014-02-09 NOTE — Telephone Encounter (Signed)
Maura aware of Dr Alford HighlandMcLean's recommendations for irbesartan and holding if SBP<110.

## 2014-02-09 NOTE — Telephone Encounter (Signed)
LMTCB for Corey [name]   

## 2014-02-10 ENCOUNTER — Telehealth: Payer: Self-pay | Admitting: Cardiology

## 2014-02-11 ENCOUNTER — Telehealth: Payer: Self-pay | Admitting: Cardiology

## 2014-02-11 NOTE — Telephone Encounter (Signed)
Faxed Copy Of D/C Signed, [facility] @ [phone] Heather/[place] Verified she received Copy Of D/C I faxed

## 2014-02-12 ENCOUNTER — Telehealth: Payer: Self-pay | Admitting: Cardiology

## 2014-02-12 NOTE — Telephone Encounter (Signed)
Original D/C Received, Kathlene NovemberMike W/ [facility] aware Dr. Shirlee LatchMcLean not back in office Until  Friday 3.6.15. Will Give to Anne/McLean on Friday

## 2014-02-13 ENCOUNTER — Telehealth: Payer: Self-pay | Admitting: Cardiology

## 2014-02-13 NOTE — Telephone Encounter (Signed)
Original D/C Signed By Tressa Busmanr.McLean, Mike W[facility] aware Ready For Pick up

## 2014-02-13 NOTE — Telephone Encounter (Signed)
D/C Picked Up 3.6.15/kdm

## 2014-02-16 ENCOUNTER — Telehealth: Payer: Self-pay | Admitting: Cardiology

## 2014-02-16 NOTE — Telephone Encounter (Signed)
Amy re faxing order for Biotene mouthwash from 01/21/14 to Dr Shirlee LatchMcLean to sign.

## 2014-02-16 NOTE — Telephone Encounter (Signed)
I do not have any orders here for Dr Shirlee LatchMcLean to sign. LMTCB for Amy

## 2014-02-16 NOTE — Telephone Encounter (Signed)
New Message:  Corey [name] states she keeps getting Orders back unsigned. Corey [name] is requesting our office sign the orders and fax them back.   [phone]

## 2014-03-11 NOTE — Telephone Encounter (Signed)
New message     Calling to report pt death at 12:30pm at his home

## 2014-03-11 NOTE — Telephone Encounter (Signed)
I will forward to Dr/nurse to inform.

## 2014-03-11 DEATH — deceased

## 2014-04-08 ENCOUNTER — Ambulatory Visit: Admitting: Cardiology
